# Patient Record
Sex: Male | Born: 1970 | Race: White | Hispanic: No | Marital: Single | Smoking: Former smoker
Health system: Southern US, Community
[De-identification: ages and names within clinical notes are randomized; demographics above are authoritative.]

## PROBLEM LIST (undated history)

## (undated) DIAGNOSIS — E119 Type 2 diabetes mellitus without complications: Secondary | ICD-10-CM

## (undated) DIAGNOSIS — I10 Essential (primary) hypertension: Secondary | ICD-10-CM

## (undated) DIAGNOSIS — J449 Chronic obstructive pulmonary disease, unspecified: Secondary | ICD-10-CM

## (undated) DIAGNOSIS — I509 Heart failure, unspecified: Secondary | ICD-10-CM

## (undated) DIAGNOSIS — I429 Cardiomyopathy, unspecified: Secondary | ICD-10-CM

## (undated) DIAGNOSIS — G4730 Sleep apnea, unspecified: Secondary | ICD-10-CM

## (undated) DIAGNOSIS — M519 Unspecified thoracic, thoracolumbar and lumbosacral intervertebral disc disorder: Secondary | ICD-10-CM

## (undated) DIAGNOSIS — E785 Hyperlipidemia, unspecified: Secondary | ICD-10-CM

## (undated) DIAGNOSIS — T7840XA Allergy, unspecified, initial encounter: Secondary | ICD-10-CM

## (undated) HISTORY — DX: Hyperlipidemia, unspecified: E78.5

## (undated) HISTORY — DX: Chronic obstructive pulmonary disease, unspecified: J44.9

## (undated) HISTORY — DX: Heart failure, unspecified: I50.9

## (undated) HISTORY — DX: Type 2 diabetes mellitus without complications: E11.9

## (undated) HISTORY — DX: Allergy, unspecified, initial encounter: T78.40XA

## (undated) HISTORY — PX: LUMBAR LAMINECTOMY: SHX95

## (undated) HISTORY — DX: Sleep apnea, unspecified: G47.30

---

## 2009-03-03 ENCOUNTER — Emergency Department (HOSPITAL_BASED_OUTPATIENT_CLINIC_OR_DEPARTMENT_OTHER): Admission: EM | Admit: 2009-03-03 | Discharge: 2009-03-03 | Payer: Self-pay | Admitting: Emergency Medicine

## 2009-03-03 ENCOUNTER — Ambulatory Visit: Payer: Self-pay | Admitting: Interventional Radiology

## 2009-07-22 ENCOUNTER — Encounter: Admission: RE | Admit: 2009-07-22 | Discharge: 2009-07-22 | Payer: Self-pay | Admitting: Family Medicine

## 2010-10-12 ENCOUNTER — Encounter: Payer: Self-pay | Admitting: Family Medicine

## 2010-12-29 LAB — DIFFERENTIAL
Lymphs Abs: 1.3 10*3/uL (ref 0.7–4.0)
Monocytes Absolute: 0.9 10*3/uL (ref 0.1–1.0)
Monocytes Relative: 8 % (ref 3–12)
Neutro Abs: 7.7 10*3/uL (ref 1.7–7.7)
Neutrophils Relative %: 74 % (ref 43–77)

## 2010-12-29 LAB — CBC
Hemoglobin: 16.9 g/dL (ref 13.0–17.0)
MCV: 86.2 fL (ref 78.0–100.0)
RBC: 6.01 MIL/uL — ABNORMAL HIGH (ref 4.22–5.81)
WBC: 10.4 10*3/uL (ref 4.0–10.5)

## 2010-12-29 LAB — BASIC METABOLIC PANEL
CO2: 26 mEq/L (ref 19–32)
Calcium: 9.4 mg/dL (ref 8.4–10.5)
Chloride: 103 mEq/L (ref 96–112)
Creatinine, Ser: 0.8 mg/dL (ref 0.4–1.5)
GFR calc Af Amer: >60 mL/min (ref >60)
Sodium: 141 mEq/L (ref 135–145)

## 2010-12-29 LAB — URINE MICROSCOPIC-ADD ON

## 2010-12-29 LAB — URINALYSIS, ROUTINE W REFLEX MICROSCOPIC
Glucose, UA: NEGATIVE mg/dL
Specific Gravity, Urine: 1.023 (ref 1.005–1.030)

## 2013-03-26 ENCOUNTER — Emergency Department (HOSPITAL_COMMUNITY): Payer: Commercial Managed Care - PPO

## 2013-03-26 ENCOUNTER — Encounter (HOSPITAL_COMMUNITY): Payer: Self-pay | Admitting: Emergency Medicine

## 2013-03-26 ENCOUNTER — Emergency Department (HOSPITAL_COMMUNITY)
Admission: EM | Admit: 2013-03-26 | Discharge: 2013-03-26 | Disposition: A | Discharge status: Home / Self Care | Payer: Commercial Managed Care - PPO | Attending: Emergency Medicine | Admitting: Emergency Medicine

## 2013-03-26 DIAGNOSIS — J449 Chronic obstructive pulmonary disease, unspecified: Secondary | ICD-10-CM

## 2013-03-26 DIAGNOSIS — I10 Essential (primary) hypertension: Secondary | ICD-10-CM | POA: Insufficient documentation

## 2013-03-26 DIAGNOSIS — F17200 Nicotine dependence, unspecified, uncomplicated: Secondary | ICD-10-CM | POA: Insufficient documentation

## 2013-03-26 DIAGNOSIS — Z8739 Personal history of other diseases of the musculoskeletal system and connective tissue: Secondary | ICD-10-CM | POA: Insufficient documentation

## 2013-03-26 DIAGNOSIS — Z79899 Other long term (current) drug therapy: Secondary | ICD-10-CM | POA: Insufficient documentation

## 2013-03-26 DIAGNOSIS — R000 Tachycardia, unspecified: Secondary | ICD-10-CM | POA: Insufficient documentation

## 2013-03-26 DIAGNOSIS — R079 Chest pain, unspecified: Secondary | ICD-10-CM | POA: Insufficient documentation

## 2013-03-26 DIAGNOSIS — J441 Chronic obstructive pulmonary disease with (acute) exacerbation: Secondary | ICD-10-CM | POA: Insufficient documentation

## 2013-03-26 HISTORY — DX: Essential (primary) hypertension: I10

## 2013-03-26 LAB — CBC WITH DIFFERENTIAL/PLATELET
Basophils Absolute: 0.1 10*3/uL (ref 0.0–0.1)
Basophils Relative: 1 % (ref 0–1)
HCT: 51.2 % (ref 39.0–52.0)
MCHC: 32.4 g/dL (ref 30.0–36.0)
Monocytes Absolute: 1 10*3/uL (ref 0.1–1.0)
Neutro Abs: 9 10*3/uL — ABNORMAL HIGH (ref 1.7–7.7)
Neutrophils Relative %: 69 % (ref 43–77)
Platelets: 346 10*3/uL (ref 150–400)
RDW: 14.1 % (ref 11.5–15.5)

## 2013-03-26 LAB — BASIC METABOLIC PANEL
Chloride: 102 mEq/L (ref 96–112)
Creatinine, Ser: 0.66 mg/dL (ref 0.50–1.35)
GFR calc Af Amer: >90 mL/min (ref >90)
Sodium: 136 mEq/L (ref 135–145)

## 2013-03-26 LAB — D-DIMER, QUANTITATIVE: D-Dimer, Quant: 0.49 ug/mL-FEU — ABNORMAL HIGH (ref 0.00–0.48)

## 2013-03-26 MED ORDER — PREDNISONE 20 MG PO TABS
40.0000 mg | ORAL_TABLET | Freq: Once | ORAL | Status: AC
  Administered 2013-03-26: 40 mg via ORAL
  Filled 2013-03-26: qty 2

## 2013-03-26 MED ORDER — IPRATROPIUM BROMIDE 0.02 % IN SOLN
0.5000 mg | Freq: Once | RESPIRATORY_TRACT | Status: AC
  Administered 2013-03-26: 0.5 mg via RESPIRATORY_TRACT
  Filled 2013-03-26: qty 2.5

## 2013-03-26 MED ORDER — IOHEXOL 350 MG/ML SOLN
80.0000 mL | Freq: Once | INTRAVENOUS | Status: AC | PRN
  Administered 2013-03-26: 80 mL via INTRAVENOUS

## 2013-03-26 MED ORDER — ALBUTEROL SULFATE HFA 108 (90 BASE) MCG/ACT IN AERS
2.0000 | INHALATION_SPRAY | RESPIRATORY_TRACT | Status: DC | PRN
  Administered 2013-03-26: 2 via RESPIRATORY_TRACT
  Filled 2013-03-26: qty 6.7

## 2013-03-26 MED ORDER — PREDNISONE 20 MG PO TABS: 40.0000 mg | ORAL_TABLET | Freq: Every day | ORAL | Status: DC

## 2013-03-26 MED ORDER — ALBUTEROL SULFATE (5 MG/ML) 0.5% IN NEBU
5.0000 mg | INHALATION_SOLUTION | Freq: Once | RESPIRATORY_TRACT | Status: AC
  Administered 2013-03-26: 5 mg via RESPIRATORY_TRACT
  Filled 2013-03-26: qty 1

## 2013-03-26 NOTE — ED Provider Notes (Addendum)
History    CSN: [id number] Arrival date & time 03/26/13  1300  First MD Initiated Contact with Patient 03/26/13 1314     Chief Complaint  Patient presents with  . Shortness of Breath  . Chest Pain   (Consider location/radiation/quality/duration/timing/severity/associated sxs/prior Treatment) Patient is a 42 y.o. male presenting with shortness of breath. The history is provided by the patient.  Shortness of Breath Severity:  Severe Onset quality:  Sudden Duration:  1 hour Timing:  Constant Progression:  Resolved Chronicity:  New Context comment:  Pt states last week had an episode of SOB lasting about that resolved and was fine till today when he had another episode that lasted about 1 hr and now resolved.  Past Medical History  Diagnosis Date  . Hypertension   . Ruptured disk    Past Surgical History  Procedure Laterality Date  . Back surgery  ruptured disk   No family history on file. History  Substance Use Topics  . Smoking status: Current Some Day Smoker  . Smokeless tobacco: Not on file     Comment: uses ecig  . Alcohol Use: Yes    Review of Systems  Respiratory: Positive for shortness of breath.     Allergies  Review of patient's allergies indicates no known allergies.  Home Medications  No current outpatient prescriptions on file. BP 160/123  Pulse 133  Temp(Src) 97.8 F (36.6 C) (Oral)  Resp 25  Ht 5\' 10"  (1.778 m)  Wt 325 lb (147.419 kg)  BMI 46.63 kg/m2  SpO2 95% Physical Exam  Nursing note and vitals reviewed. Constitutional: He is oriented to person, place, and time. He appears well-developed and well-nourished. No distress.  HENT:  Head: Normocephalic and atraumatic.  Mouth/Throat: Oropharynx is clear and moist.  Eyes: Conjunctivae and EOM are normal. Pupils are equal, round, and reactive to light.  Neck: Normal range of motion. Neck supple.  Cardiovascular: Regular rhythm and intact distal pulses.  Tachycardia present.   No  murmur heard. Pulmonary/Chest: Tachypnea noted. No respiratory distress. He has wheezes. He has no rales.  Abdominal: Soft. He exhibits no distension. There is no tenderness. There is no rebound and no guarding.  Musculoskeletal: Normal range of motion. He exhibits no edema and no tenderness.  Neurological: He is alert and oriented to person, place, and time.  Skin: Skin is warm and dry. No rash noted. No erythema.  Psychiatric: He has a normal mood and affect. His behavior is normal.    ED Course  Procedures (including critical care time) Labs Reviewed  CBC WITH DIFFERENTIAL - Abnormal; Notable for the following:    WBC 13.0 (*)    RBC 5.92 (*)    Neutro Abs 9.0 (*)    All other components within normal limits  D-DIMER, QUANTITATIVE - Abnormal; Notable for the following:    D-Dimer, Quant 0.49 (*)    All other components within normal limits  BASIC METABOLIC PANEL  POCT I-STAT TROPONIN I   Dg Chest 2 View  03/26/2013   *RADIOLOGY REPORT*  Clinical Data: Shortness of breath, left chest pain  CHEST - 2 VIEW  Comparison: None.  Findings: Increased interstitial markings, possibly reflecting mild interstitial edema or bronchitic changes.  No pleural effusion or pneumothorax.  Mild cardiomegaly.  Degenerative changes of the visualized thoracolumbar spine.  IMPRESSION: Increased interstitial markings, possibly reflecting mild interstitial edema or bronchitic changes.   Original Report Authenticated By: Charline Bills, M.D.     Date: 03/26/2013  Rate:  123  Rhythm: sinus tachycardia  QRS Axis: normal  Intervals: normal  ST/T Wave abnormalities: nonspecific ST/T changes  Conduction Disutrbances:none  Narrative Interpretation:   Old EKG Reviewed: none available   1. COPD (chronic obstructive pulmonary disease)     MDM   Patient with episodic shortness of breath that recurred today and is now resolved however patient has persistent tachycardia with pulse between 10/11/1930 and is  also hypertensive. He is a smoker and states he probably has high blood pressure but does not see a doctor. He denies any history of diabetes, coronary artery disease or hyperlipidemia. He recently had a trip to [place] 3 weeks ago and since being back this is the second episode of shortness of breath. Rhythm he is slightly diaphoretic, tachycardic and tachypnea decreased breath sounds and wheezing. He has had intermittent pleuritic chest pain which is currently resolved.  Concern for her PE versus infectious etiology versus COPD exacerbation. A concern for cardiac etiology however troponin sent and EKG shows sinus tachycardia with nonspecific ST and T wave changes.  CBC, BMP, d-dimer, chest x-ray pending. Patient given albuterol and Atrovent to monitor improvement in breath sounds.   2:20 PM CBC with leukocytosis of 13,000.  D-dimer positive at .49.  Troponin neg.  CXR with increased interstitial markings. Will get CTA to further eval.  Pt checked out to Dr. Rubin Payor at 1600.  Gwyneth Sprout, MD 03/27/13 1610  Gwyneth Sprout, MD 04/03/13 1511

## 2013-03-26 NOTE — ED Notes (Signed)
Patient transported to X-ray 

## 2013-03-26 NOTE — ED Notes (Signed)
Pt c/o left side abdominal pain onset 0900. Pt c/o shortness of breath. Pt has similar symptoms 1 week ago but resolved on its own. Pt seen at [facility] and sent here for further eval.

## 2013-03-26 NOTE — ED Notes (Signed)
Pt O2 dropped to 85% while walking in the hall. Pt stated that he was ok and felt fine the whole walk.

## 2013-03-26 NOTE — ED Provider Notes (Signed)
Patient with shortness of breath. CT angiography does not show pulmonary embolism. Patient felt good with ambulation, however his saturation drops to the upper 80s. Patient would rather not be admitted. He is at his baseline dyspnea. He will follow with pulmonology was given inhaler and oral steroids. If he has a return of his shortness of breath he will return  Juliet Rude. Rubin Payor, MD 03/26/13 1820

## 2013-07-22 ENCOUNTER — Encounter (HOSPITAL_COMMUNITY): Payer: Self-pay | Admitting: Emergency Medicine

## 2013-07-22 ENCOUNTER — Emergency Department (HOSPITAL_COMMUNITY): Payer: Commercial Managed Care - PPO

## 2013-07-22 DIAGNOSIS — Z79899 Other long term (current) drug therapy: Secondary | ICD-10-CM

## 2013-07-22 DIAGNOSIS — M519 Unspecified thoracic, thoracolumbar and lumbosacral intervertebral disc disorder: Secondary | ICD-10-CM | POA: Diagnosis present

## 2013-07-22 DIAGNOSIS — I110 Hypertensive heart disease with heart failure: Principal | ICD-10-CM | POA: Diagnosis present

## 2013-07-22 DIAGNOSIS — I5023 Acute on chronic systolic (congestive) heart failure: Secondary | ICD-10-CM | POA: Diagnosis present

## 2013-07-22 DIAGNOSIS — Z7982 Long term (current) use of aspirin: Secondary | ICD-10-CM

## 2013-07-22 DIAGNOSIS — Z6841 Body Mass Index (BMI) 40.0 and over, adult: Secondary | ICD-10-CM

## 2013-07-22 DIAGNOSIS — Z8042 Family history of malignant neoplasm of prostate: Secondary | ICD-10-CM

## 2013-07-22 DIAGNOSIS — R0902 Hypoxemia: Secondary | ICD-10-CM | POA: Diagnosis present

## 2013-07-22 DIAGNOSIS — Z8249 Family history of ischemic heart disease and other diseases of the circulatory system: Secondary | ICD-10-CM

## 2013-07-22 DIAGNOSIS — Z87891 Personal history of nicotine dependence: Secondary | ICD-10-CM

## 2013-07-22 DIAGNOSIS — I509 Heart failure, unspecified: Secondary | ICD-10-CM | POA: Diagnosis present

## 2013-07-22 DIAGNOSIS — Z833 Family history of diabetes mellitus: Secondary | ICD-10-CM

## 2013-07-22 DIAGNOSIS — I428 Other cardiomyopathies: Secondary | ICD-10-CM | POA: Diagnosis present

## 2013-07-22 DIAGNOSIS — I2789 Other specified pulmonary heart diseases: Secondary | ICD-10-CM | POA: Diagnosis present

## 2013-07-22 DIAGNOSIS — G4733 Obstructive sleep apnea (adult) (pediatric): Secondary | ICD-10-CM | POA: Diagnosis present

## 2013-07-22 DIAGNOSIS — I2510 Atherosclerotic heart disease of native coronary artery without angina pectoris: Secondary | ICD-10-CM | POA: Diagnosis present

## 2013-07-22 NOTE — ED Notes (Signed)
The pt is c/o rt lat chest pain for approx one week.  Tonight just pta he felt something pop in that area and had sharp pain in his chest and increased sob.   Hyperventilating on arrival

## 2013-07-23 ENCOUNTER — Inpatient Hospital Stay (HOSPITAL_COMMUNITY)
Admission: EM | Admit: 2013-07-23 | Discharge: 2013-07-26 | DRG: 287 | Disposition: A | Discharge status: Home / Self Care | Payer: Commercial Managed Care - PPO | Attending: Internal Medicine | Admitting: Internal Medicine

## 2013-07-23 ENCOUNTER — Emergency Department (HOSPITAL_COMMUNITY): Payer: Commercial Managed Care - PPO

## 2013-07-23 ENCOUNTER — Encounter (HOSPITAL_COMMUNITY): Payer: Self-pay | Admitting: Internal Medicine

## 2013-07-23 DIAGNOSIS — I5023 Acute on chronic systolic (congestive) heart failure: Secondary | ICD-10-CM | POA: Diagnosis present

## 2013-07-23 DIAGNOSIS — R0602 Shortness of breath: Secondary | ICD-10-CM

## 2013-07-23 DIAGNOSIS — I119 Hypertensive heart disease without heart failure: Secondary | ICD-10-CM | POA: Diagnosis present

## 2013-07-23 DIAGNOSIS — R10A0 Flank pain, unspecified side: Secondary | ICD-10-CM | POA: Diagnosis present

## 2013-07-23 DIAGNOSIS — I10 Essential (primary) hypertension: Secondary | ICD-10-CM

## 2013-07-23 DIAGNOSIS — I5021 Acute systolic (congestive) heart failure: Secondary | ICD-10-CM

## 2013-07-23 DIAGNOSIS — R0609 Other forms of dyspnea: Secondary | ICD-10-CM

## 2013-07-23 DIAGNOSIS — R0603 Acute respiratory distress: Secondary | ICD-10-CM | POA: Diagnosis present

## 2013-07-23 DIAGNOSIS — I5031 Acute diastolic (congestive) heart failure: Secondary | ICD-10-CM

## 2013-07-23 DIAGNOSIS — I420 Dilated cardiomyopathy: Secondary | ICD-10-CM | POA: Diagnosis present

## 2013-07-23 DIAGNOSIS — I369 Nonrheumatic tricuspid valve disorder, unspecified: Secondary | ICD-10-CM

## 2013-07-23 DIAGNOSIS — R109 Unspecified abdominal pain: Secondary | ICD-10-CM | POA: Diagnosis present

## 2013-07-23 DIAGNOSIS — I509 Heart failure, unspecified: Secondary | ICD-10-CM

## 2013-07-23 DIAGNOSIS — M519 Unspecified thoracic, thoracolumbar and lumbosacral intervertebral disc disorder: Secondary | ICD-10-CM | POA: Insufficient documentation

## 2013-07-23 HISTORY — DX: Unspecified thoracic, thoracolumbar and lumbosacral intervertebral disc disorder: M51.9

## 2013-07-23 HISTORY — DX: Morbid (severe) obesity due to excess calories: E66.01

## 2013-07-23 HISTORY — DX: Cardiomyopathy, unspecified: I42.9

## 2013-07-23 LAB — COMPREHENSIVE METABOLIC PANEL
ALT: 27 U/L (ref 0–53)
AST: 23 U/L (ref 0–37)
Albumin: 3.9 g/dL (ref 3.5–5.2)
CO2: 27 mEq/L (ref 19–32)
Chloride: 98 mEq/L (ref 96–112)
GFR calc non Af Amer: >90 mL/min (ref >90)
Sodium: 136 mEq/L (ref 135–145)
Total Bilirubin: 0.7 mg/dL (ref 0.3–1.2)
Total Protein: 7.2 g/dL (ref 6.0–8.3)

## 2013-07-23 LAB — POCT I-STAT 3, ART BLOOD GAS (G3+)
Acid-base deficit: 1 mmol/L (ref 0.0–2.0)
Bicarbonate: 23.9 mEq/L (ref 20.0–24.0)
O2 Saturation: 95 %
TCO2: 25 mmol/L (ref 0–100)
pCO2 arterial: 41.8 mmHg (ref 35.0–45.0)
pO2, Arterial: 81 mmHg (ref 80.0–100.0)

## 2013-07-23 LAB — BASIC METABOLIC PANEL
Calcium: 9.6 mg/dL (ref 8.4–10.5)
Creatinine, Ser: 0.83 mg/dL (ref 0.50–1.35)
GFR calc non Af Amer: >90 mL/min (ref >90)
Glucose, Bld: 140 mg/dL — ABNORMAL HIGH (ref 70–99)
Potassium: 4.1 mEq/L (ref 3.5–5.1)
Sodium: 137 mEq/L (ref 135–145)

## 2013-07-23 LAB — CBC WITH DIFFERENTIAL/PLATELET
Basophils Absolute: 0.1 10*3/uL (ref 0.0–0.1)
Eosinophils Absolute: 0.3 10*3/uL (ref 0.0–0.7)
Eosinophils Relative: 2 % (ref 0–5)
Lymphocytes Relative: 16 % (ref 12–46)
Lymphs Abs: 1.8 10*3/uL (ref 0.7–4.0)
MCH: 28.7 pg (ref 26.0–34.0)
MCV: 86 fL (ref 78.0–100.0)
Neutro Abs: 8.3 10*3/uL — ABNORMAL HIGH (ref 1.7–7.7)
Neutrophils Relative %: 74 % (ref 43–77)
Platelets: 366 10*3/uL (ref 150–400)
RBC: 5.5 MIL/uL (ref 4.22–5.81)
RDW: 14.7 % (ref 11.5–15.5)
WBC: 11.2 10*3/uL — ABNORMAL HIGH (ref 4.0–10.5)

## 2013-07-23 LAB — RAPID URINE DRUG SCREEN, HOSP PERFORMED
Benzodiazepines: NOT DETECTED
Cocaine: NOT DETECTED
Opiates: POSITIVE — AB
Tetrahydrocannabinol: NOT DETECTED

## 2013-07-23 LAB — POCT I-STAT TROPONIN I: Troponin i, poc: 0.04 ng/mL (ref 0.00–0.08)

## 2013-07-23 LAB — HEMOGLOBIN A1C: Hgb A1c MFr Bld: 6 % — ABNORMAL HIGH (ref <5.7)

## 2013-07-23 LAB — MRSA PCR SCREENING: MRSA by PCR: NEGATIVE

## 2013-07-23 LAB — PRO B NATRIURETIC PEPTIDE: Pro B Natriuretic peptide (BNP): 3783 pg/mL — ABNORMAL HIGH (ref 0–125)

## 2013-07-23 MED ORDER — MORPHINE SULFATE 4 MG/ML IJ SOLN
4.0000 mg | Freq: Once | INTRAMUSCULAR | Status: DC
  Filled 2013-07-23: qty 1

## 2013-07-23 MED ORDER — ENOXAPARIN SODIUM 40 MG/0.4ML ~~LOC~~ SOLN
40.0000 mg | SUBCUTANEOUS | Status: DC
  Administered 2013-07-23 – 2013-07-26 (×4): 40 mg via SUBCUTANEOUS
  Filled 2013-07-23 (×5): qty 0.4

## 2013-07-23 MED ORDER — POTASSIUM CHLORIDE CRYS ER 10 MEQ PO TBCR
10.0000 meq | EXTENDED_RELEASE_TABLET | Freq: Two times a day (BID) | ORAL | Status: AC
  Administered 2013-07-23 – 2013-07-25 (×5): 10 meq via ORAL
  Filled 2013-07-23 (×5): qty 1

## 2013-07-23 MED ORDER — ASPIRIN EC 325 MG PO TBEC
325.0000 mg | DELAYED_RELEASE_TABLET | Freq: Every day | ORAL | Status: DC
  Administered 2013-07-23 – 2013-07-25 (×3): 325 mg via ORAL
  Filled 2013-07-23 (×3): qty 1

## 2013-07-23 MED ORDER — IOHEXOL 350 MG/ML SOLN
100.0000 mL | Freq: Once | INTRAVENOUS | Status: AC | PRN
  Administered 2013-07-23: 100 mL via INTRAVENOUS

## 2013-07-23 MED ORDER — FUROSEMIDE 10 MG/ML IJ SOLN: 40.0000 mg | Freq: Two times a day (BID) | INTRAMUSCULAR | Status: DC

## 2013-07-23 MED ORDER — MORPHINE SULFATE 2 MG/ML IJ SOLN
1.0000 mg | INTRAMUSCULAR | Status: DC | PRN
  Administered 2013-07-23: 1 mg via INTRAVENOUS
  Filled 2013-07-23: qty 1

## 2013-07-23 MED ORDER — SODIUM CHLORIDE 0.9 % IJ SOLN: 3.0000 mL | INTRAMUSCULAR | Status: DC | PRN

## 2013-07-23 MED ORDER — MORPHINE SULFATE 4 MG/ML IJ SOLN
4.0000 mg | INTRAMUSCULAR | Status: DC | PRN
  Administered 2013-07-23 – 2013-07-24 (×3): 4 mg via INTRAVENOUS
  Filled 2013-07-23 (×2): qty 1

## 2013-07-23 MED ORDER — SODIUM CHLORIDE 0.9 % IJ SOLN
3.0000 mL | Freq: Two times a day (BID) | INTRAMUSCULAR | Status: DC
  Administered 2013-07-23 – 2013-07-26 (×7): 3 mL via INTRAVENOUS

## 2013-07-23 MED ORDER — HYDROCODONE-ACETAMINOPHEN 5-325 MG PO TABS
1.0000 | ORAL_TABLET | Freq: Four times a day (QID) | ORAL | Status: DC | PRN
  Administered 2013-07-23 (×2): 1 via ORAL
  Administered 2013-07-24 (×2): 2 via ORAL
  Filled 2013-07-23: qty 2
  Filled 2013-07-23 (×2): qty 1
  Filled 2013-07-23: qty 2

## 2013-07-23 MED ORDER — CARVEDILOL 3.125 MG PO TABS
3.1250 mg | ORAL_TABLET | Freq: Two times a day (BID) | ORAL | Status: DC
  Administered 2013-07-23 – 2013-07-26 (×7): 3.125 mg via ORAL
  Filled 2013-07-23 (×9): qty 1

## 2013-07-23 MED ORDER — MORPHINE SULFATE 4 MG/ML IJ SOLN
4.0000 mg | Freq: Once | INTRAMUSCULAR | Status: AC
  Administered 2013-07-23: 4 mg via INTRAVENOUS
  Filled 2013-07-23: qty 1

## 2013-07-23 MED ORDER — FUROSEMIDE 10 MG/ML IJ SOLN
INTRAMUSCULAR | Status: AC
  Filled 2013-07-23: qty 4

## 2013-07-23 MED ORDER — MORPHINE SULFATE 4 MG/ML IJ SOLN
INTRAMUSCULAR | Status: AC
  Filled 2013-07-23: qty 1

## 2013-07-23 MED ORDER — LISINOPRIL 10 MG PO TABS
10.0000 mg | ORAL_TABLET | Freq: Every day | ORAL | Status: DC
  Administered 2013-07-23 – 2013-07-26 (×4): 10 mg via ORAL
  Filled 2013-07-23 (×4): qty 1

## 2013-07-23 MED ORDER — ONDANSETRON HCL 4 MG/2ML IJ SOLN: 4.0000 mg | Freq: Four times a day (QID) | INTRAMUSCULAR | Status: DC | PRN

## 2013-07-23 MED ORDER — FUROSEMIDE 10 MG/ML IJ SOLN
40.0000 mg | Freq: Two times a day (BID) | INTRAMUSCULAR | Status: DC
  Administered 2013-07-23 (×2): 40 mg via INTRAVENOUS
  Filled 2013-07-23 (×4): qty 4

## 2013-07-23 MED ORDER — SODIUM CHLORIDE 0.9 % IV SOLN: 250.0000 mL | INTRAVENOUS | Status: DC | PRN

## 2013-07-23 MED ORDER — ACETAMINOPHEN 325 MG PO TABS: 650.0000 mg | ORAL_TABLET | ORAL | Status: DC | PRN

## 2013-07-23 NOTE — ED Provider Notes (Addendum)
42 year old male comes in with pelvic history of chest pain and dyspnea which have been getting worse. Workup was started in the ED and he became suddenly more dyspneic and diaphoretic. On exam, lungs are clear and heart has regular rate and rhythm. He was noted to be tachycardic and diaphoretic. No neck vein distention is seen. There was 1+ presacral edema and 2+ pretibial edema and was up very likely to be in CHF. Chest x-ray was consistent with CHF and BNP has come back significantly elevated. He was placed on BiPAP and furosemide and improved dramatically. He had a good diuresis. He does not carry a prior diagnosis of CHF. CT angiogram showed bilateral pleural effusions, mild pulmonary edema, and extensive coronary artery calcifications. He'll need to be admitted for further evaluation.  CRITICAL CARE Performed by: Dione Booze Total critical care time: 40 minutes Critical care time was exclusive of separately billable procedures and treating other patients. Critical care was necessary to treat or prevent imminent or life-threatening deterioration. Critical care was time spent personally by me on the following activities: development of treatment plan with patient and/or surrogate as well as nursing, discussions with consultants, evaluation of patient's response to treatment, examination of patient, obtaining history from patient or surrogate, ordering and performing treatments and interventions, ordering and review of laboratory studies, ordering and review of radiographic studies, pulse oximetry and re-evaluation of patient's condition.  Medical screening examination/treatment/procedure(s) were conducted as a shared visit with non-physician practitioner(s) and myself.  I personally evaluated the patient during the encounter.  EKG Interpretation     Ventricular Rate:  120 PR Interval:  146 QRS Duration: 100 QT Interval:  350 QTC Calculation: 494 R Axis:   34 Text Interpretation:  Sinus  tachycardia Left ventricular hypertrophy with repolarization abnormality Abnormal ECG When compared with ECG of 03/26/2013, No significant change was found           Results for orders placed during the hospital encounter of 07/23/13  BASIC METABOLIC PANEL      Result Value Range   Sodium 137  135 - 145 mEq/L   Potassium 4.1  3.5 - 5.1 mEq/L   Chloride 101  96 - 112 mEq/L   CO2 24  19 - 32 mEq/L   Glucose, Bld 140 (*) 70 - 99 mg/dL   BUN 14  6 - 23 mg/dL   Creatinine, Ser 2.84  0.50 - 1.35 mg/dL   Calcium 9.6  8.4 - 13.2 mg/dL   GFR calc non Af Amer >90  >90 mL/min   GFR calc Af Amer >90  >90 mL/min  CBC WITH DIFFERENTIAL      Result Value Range   WBC 11.2 (*) 4.0 - 10.5 K/uL   RBC 5.50  4.22 - 5.81 MIL/uL   Hemoglobin 15.8  13.0 - 17.0 g/dL   HCT 44.0  10.2 - 72.5 %   MCV 86.0  78.0 - 100.0 fL   MCH 28.7  26.0 - 34.0 pg   MCHC 33.4  30.0 - 36.0 g/dL   RDW 36.6  44.0 - 34.7 %   Platelets 366  150 - 400 K/uL   Neutrophils Relative % 74  43 - 77 %   Neutro Abs 8.3 (*) 1.7 - 7.7 K/uL   Lymphocytes Relative 16  12 - 46 %   Lymphs Abs 1.8  0.7 - 4.0 K/uL   Monocytes Relative 7  3 - 12 %   Monocytes Absolute 0.8  0.1 - 1.0 K/uL  Eosinophils Relative 2  0 - 5 %   Eosinophils Absolute 0.3  0.0 - 0.7 K/uL   Basophils Relative 1  0 - 1 %   Basophils Absolute 0.1  0.0 - 0.1 K/uL  PRO B NATRIURETIC PEPTIDE      Result Value Range   Pro B Natriuretic peptide (BNP) 3783.0 (*) 0 - 125 pg/mL  POCT I-STAT TROPONIN I      Result Value Range   Troponin i, poc 0.04  0.00 - 0.08 ng/mL   Comment 3            Dg Chest 2 View  07/22/2013   CLINICAL DATA:  Shortness of breath for 1 hr since feeling a pop and a hot sensation at the right side of is lower chest, history COPD, former smoker, hypertension  EXAM: CHEST  2 VIEW  COMPARISON:  03/26/2013  FINDINGS: Enlargement of cardiac silhouette with pulmonary vascular congestion.  Question medial right basilar infiltrate, markings slightly  increased versus previous study.  Mild chronic bronchitic changes.  Remaining lungs clear.  No pleural effusion or pneumothorax.  Bones demineralized.  IMPRESSION: Cancer congestion  Bronchitic changes with question medial right basilar infiltrate.   Electronically Signed   By: Ulyses Southward M.D.   On: 07/22/2013 23:51   Ct Angio Chest Pe W/cm &/or Wo Cm  07/23/2013   CLINICAL DATA:  Shortness of breath.  EXAM: CT ANGIOGRAPHY CHEST WITH CONTRAST  TECHNIQUE: Multidetector CT imaging of the chest was performed using the standard protocol during bolus administration of intravenous contrast. Multiplanar CT image reconstructions including MIPs were obtained to evaluate the vascular anatomy.  CONTRAST:  OMNIPAQUE IOHEXOL 350 MG/ML SOLN  COMPARISON:  03/26/2013  FINDINGS: THORACIC INLET/BODY WALL:  No acute abnormality.  MEDIASTINUM:  Cardiomegaly, with the left ventricle particularly dilated. Extensive coronary artery atherosclerosis, age advanced. No pericardial effusion. No pulmonary embolism seen. Sensitivity decreased beyond the segmental pulmonary artery at multiple levels due to respiratory motion. Mild lymphadenopathy, with interval increase likely congestive.  LUNG WINDOWS:  Diffuse interlobular septal thickening with patchy ground-glass opacities small, layering bilateral pleural effusions. There is a diffuse bronchial wall thickening which is likely interstitial/bronchovascular edema. No asymmetric opacity to suggest superimposed consolidative pneumonia.  UPPER ABDOMEN:  No acute findings.  OSSEOUS:  No acute fracture.  No suspicious lytic or blastic lesions.  Review of the MIP images confirms the above findings.  IMPRESSION: 1. CHF. 2. Cardiomegaly and extensive, age advanced coronary artery atherosclerosis. 3. Negative for pulmonary embolism to the level of the segmental arteries.   Electronically Signed   By: Tiburcio Pea M.D.   On: 07/23/2013 03:18   Images viewed by me.   Case is discussed  with Dr. Lovell Sheehan of [facility] who agrees to admit the patient.  Dione Booze, MD 07/23/13 0340  Dione Booze, MD 07/23/13 [phone]

## 2013-07-23 NOTE — Progress Notes (Addendum)
Patient admitted by Dr. Lovell Sheehan this AM.  Please see H&P.  Will order pain medications and await further work up. 1. Acute CHF- New, CHF Protocol ordered, and Diurese with IV lasix, Monitor electrolytes, 2-D ECHO ordered, and Initiate Carvedilol, and Lisinopril Rx. Cycle Troponins, and check TSH level.  2. Respiratory Distress- Due to #1, Placed on BiPAP-wean as toelrated.  3. Chest pain- Cycle troponins. CTA of chest Negative for PE or dissection.  3. HTN- Monitor BPs, Started carvedilol, and Lisinopril rx.  4. Morbid Obesity- encourage Weight Loss.   Stephen Canary DO  Addendum EF came back as 20%- cards consulted

## 2013-07-23 NOTE — H&P (Signed)
[facility] History and Physical  Stephen Kane WUJ:[id number] DOB: [birth year] DOA: 07/23/2013  Referring physician:   EDP PCP: No PCP Per Patient  Specialists:   Chief Complaint: SOB  HPI: Stephen Kane is a 42 y.o. male with a history of HTN who presents to the ED with complaints of worsening SOB over the past week.   He came to the ED tonight due to the SOB and pain in his right side. He denies having chest pain.  He does report having orthopnea and DOE, and he had not noticed that he had lower extremity edema until it was pointed out to him by the EDP.    When he arrived in the ED, he was hypoxic and was placed on BIPAP and on his evaluation his BNP returned elevated at 3783.0, and a Chest X-Ray and CTA of the Chest revealed Cardiomegaly, and diffuse Interstitial Bronchovascular Edema. He was administered IV lasix x 1 dose and referred for medical admission.       Review of Systems: The patient denies anorexia, fever, chills, headaches, weight loss,, vision loss, diplopia, dizziness, decreased hearing, rhinitis, hoarseness, syncope, balance deficits, cough, hemoptysis, abdominal pain, nausea, vomiting, diarrhea, constipation, hematemesis, melena, hematochezia, severe indigestion/heartburn, dysuria, hematuria, incontinence, muscle weakness, suspicious skin lesions, transient blindness, difficulty walking, depression, unusual weight change, abnormal bleeding, enlarged lymph nodes, angioedema, and breast masses.    Past Medical History  Diagnosis Date  . Hypertension   . Ruptured disk     Past Surgical History  Procedure Laterality Date  . Back surgery  ruptured disk    Lumbar Spine    Prior to Admission medications   Medication Sig Start Date End Date Taking? Authorizing Provider  albuterol (PROVENTIL HFA;VENTOLIN HFA) 108 (90 BASE) MCG/ACT inhaler Inhale 2 puffs into the lungs every 6 (six) hours as needed for wheezing (wheezing).   Yes Historical Provider, MD    No  Known Allergies  Social History:  reports that he has been smoking.  He does not have any smokeless tobacco history on file. He reports that he drinks alcohol. He reports that he does not use illicit drugs.     Family History  Problem Relation Age of Onset  . Diabetes Father   . Diabetes Father      Physical Exam:  GEN:  Pleasant Morbidly Obese 42 y.o.  Caucasian male  examined  and in no acute distress; cooperative with exam Filed Vitals:   07/23/13 0245 07/23/13 0345 07/23/13 0430 07/23/13 0450  BP: 142/93 127/80 131/77   Pulse: 105 100 97 94  Temp:      Resp: 23 19 19 19   SpO2: 99% 99% 98% 97%   Blood pressure 131/77, pulse 94, temperature 98.1 F (36.7 C), resp. rate 19, SpO2 97.00%. PSYCH: He is alert and oriented x4; does not appear anxious does not appear depressed; affect is normal HEENT: Normocephalic and Atraumatic, Mucous membranes pink; PERRLA; EOM intact; Fundi:  Benign;  No scleral icterus, Nares: Patent, Oropharynx: Clear, Fair Dentition, Neck:  FROM, no cervical lymphadenopathy nor thyromegaly or carotid bruit; no JVD; Breasts:: Not examined CHEST WALL: No tenderness CHEST: Normal respiration, clear to auscultation bilaterally HEART: Regular rate and rhythm; no murmurs rubs or gallops BACK: No kyphosis or scoliosis; no CVA tenderness ABDOMEN: Positive Bowel Sounds,  Obese, soft non-tender; no masses, no organomegaly, no pannus; no intertriginous candida. Rectal Exam: Not done EXTREMITIES: No  cyanosis, clubbing or  2+ BLE edema; no ulcerations. Genitalia: not examined PULSES: 2+ and  symmetric SKIN: Normal hydration no rash or ulceration CNS: Cranial nerves 2-12 grossly intact no focal neurologic deficit    Labs on Admission:  Basic Metabolic Panel:  Recent Labs Lab 07/23/13 0101  NA 137  K 4.1  CL 101  CO2 24  GLUCOSE 140*  BUN 14  CREATININE 0.83  CALCIUM 9.6   Liver Function Tests: No results found for this basename: AST, ALT, ALKPHOS,  BILITOT, PROT, ALBUMIN,  in the last 168 hours No results found for this basename: LIPASE, AMYLASE,  in the last 168 hours No results found for this basename: AMMONIA,  in the last 168 hours CBC:  Recent Labs Lab 07/23/13 0101  WBC 11.2*  NEUTROABS 8.3*  HGB 15.8  HCT 47.3  MCV 86.0  PLT 366   Cardiac Enzymes: No results found for this basename: CKTOTAL, CKMB, CKMBINDEX, TROPONINI,  in the last 168 hours  BNP (last 3 results)  Recent Labs  07/23/13 0215  PROBNP 3783.0*   CBG: No results found for this basename: GLUCAP,  in the last 168 hours  Radiological Exams on Admission: Dg Chest 2 View  07/22/2013   CLINICAL DATA:  Shortness of breath for 1 hr since feeling a pop and a hot sensation at the right side of is lower chest, history COPD, former smoker, hypertension  EXAM: CHEST  2 VIEW  COMPARISON:  03/26/2013  FINDINGS: Enlargement of cardiac silhouette with pulmonary vascular congestion.  Question medial right basilar infiltrate, markings slightly increased versus previous study.  Mild chronic bronchitic changes.  Remaining lungs clear.  No pleural effusion or pneumothorax.  Bones demineralized.  IMPRESSION: Cancer congestion  Bronchitic changes with question medial right basilar infiltrate.   Electronically Signed   By: Ulyses Southward M.D.   On: 07/22/2013 23:51   Ct Angio Chest Pe W/cm &/or Wo Cm  07/23/2013   CLINICAL DATA:  Shortness of breath.  EXAM: CT ANGIOGRAPHY CHEST WITH CONTRAST  TECHNIQUE: Multidetector CT imaging of the chest was performed using the standard protocol during bolus administration of intravenous contrast. Multiplanar CT image reconstructions including MIPs were obtained to evaluate the vascular anatomy.  CONTRAST:  OMNIPAQUE IOHEXOL 350 MG/ML SOLN  COMPARISON:  03/26/2013  FINDINGS: THORACIC INLET/BODY WALL:  No acute abnormality.  MEDIASTINUM:  Cardiomegaly, with the left ventricle particularly dilated. Extensive coronary artery atherosclerosis, age  advanced. No pericardial effusion. No pulmonary embolism seen. Sensitivity decreased beyond the segmental pulmonary artery at multiple levels due to respiratory motion. Mild lymphadenopathy, with interval increase likely congestive.  LUNG WINDOWS:  Diffuse interlobular septal thickening with patchy ground-glass opacities small, layering bilateral pleural effusions. There is a diffuse bronchial wall thickening which is likely interstitial/bronchovascular edema. No asymmetric opacity to suggest superimposed consolidative pneumonia.  UPPER ABDOMEN:  No acute findings.  OSSEOUS:  No acute fracture.  No suspicious lytic or blastic lesions.  Review of the MIP images confirms the above findings.  IMPRESSION: 1. CHF. 2. Cardiomegaly and extensive, age advanced coronary artery atherosclerosis. 3. Negative for pulmonary embolism to the level of the segmental arteries.   Electronically Signed   By: Tiburcio Pea M.D.   On: 07/23/2013 03:18     EKG: Independently reviewed. Sinus Tachycardia, LVH Findings, No acute S-T changes.      Assessment/Plan Principal Problem:   Acute diastolic CHF (congestive heart failure) Active Problems:   Respiratory distress   Hypertension    1.  Acute Diastolic CHF-  New,  CHF Protocol ordered, and Diurese with IV lasix,  Monitor electrolytes, 2-D ECHO ordered, and Initiate Carvedilol, and  Lisinopril  Rx.   Cycle Troponins, and check TSH level.    2.  Respiratory Distress- Due to #1,  Placed on BiPAP.     3.  Chest pain-  Cycle troponins.   CTA of  chest Negative for PE or dissection.     3.  HTN-   Monitor BPs,  Started carvedilol, and Lisinopril rx.     4.  Morbid Obesity-  Needed Weight Loss.    5.  DVT prophylaxis with Lovenox.        Code Status:   FULL CODE Family Communication:    No Family present Disposition Plan:       Inpatient  Time spent:   76 Minutes  Ron Parker [facility] Pager [phone]  If 7PM-7AM, please contact  night-coverage [email or web address] Password [email or web address] 07/23/2013, 5:16 AM

## 2013-07-23 NOTE — Plan of Care (Signed)
Problem: Phase I Progression Outcomes Goal: Pain controlled with appropriate interventions Outcome: Not Progressing Readjusting pain medications, encouraging other pain relief measures (repositioning, distraction)

## 2013-07-23 NOTE — Progress Notes (Signed)
Utilization Review Completed.Stephen Kane T11/10/2012  

## 2013-07-23 NOTE — ED Provider Notes (Signed)
CSN: [id number]     Arrival date & time 07/22/13  2250 History   First MD Initiated Contact with Patient 07/23/13 0024     Chief Complaint  Patient presents with  . Shortness of Breath   (Consider location/radiation/quality/duration/timing/severity/associated sxs/prior Treatment) HPI History provided by pt.   Pt has had mild pain in right anterior chest for the past week.  While sitting at his desk last night, he coughed, heard a pop in his right side, and has had severe, non-radiating, pleuritic pain that is aggravated by movement and associated w/ SOB ever since.  Denies fever, cough, abd pain, N/V, LE edema/pain.  No recent trauma.  No RF for PE.   Past Medical History  Diagnosis Date  . Hypertension   . Ruptured disk    Past Surgical History  Procedure Laterality Date  . Back surgery  ruptured disk   No family history on file. History  Substance Use Topics  . Smoking status: Current Some Day Smoker  . Smokeless tobacco: Not on file     Comment: uses ecig  . Alcohol Use: Yes    Review of Systems  All other systems reviewed and are negative.    Allergies  Review of patient's allergies indicates no known allergies.  Home Medications   Current Outpatient Rx  Name  Route  Sig  Dispense  Refill  . albuterol (PROVENTIL HFA;VENTOLIN HFA) 108 (90 BASE) MCG/ACT inhaler   Inhalation   Inhale 2 puffs into the lungs every 6 (six) hours as needed for wheezing (wheezing).          BP 150/99  Pulse 97  Temp(Src) 98.1 F (36.7 C)  Resp 32  SpO2 96% Physical Exam  Nursing note and vitals reviewed. Constitutional: He is oriented to person, place, and time. He appears well-developed and well-nourished. No distress.  HENT:  Head: Normocephalic and atraumatic.  Eyes:  Normal appearance  Neck: Normal range of motion.  Cardiovascular: Regular rhythm and intact distal pulses.   tachycardic  Pulmonary/Chest: Breath sounds normal. No respiratory distress. He exhibits no  tenderness.  Pleuritic pain reported.  Shallow respirations.  Tachypneic   Abdominal: Soft. Bowel sounds are normal. He exhibits no distension. There is no tenderness. There is no guarding.  Musculoskeletal: Normal range of motion.  No peripheral edema or calf tenderness  Neurological: He is alert and oriented to person, place, and time.  Skin: Skin is warm. No rash noted.  Clammy  Psychiatric: He has a normal mood and affect. His behavior is normal.    ED Course  Procedures (including critical care time) Labs Review Labs Reviewed  BASIC METABOLIC PANEL - Abnormal; Notable for the following:    Glucose, Bld 140 (*)    All other components within normal limits  CBC WITH DIFFERENTIAL - Abnormal; Notable for the following:    WBC 11.2 (*)    Neutro Abs 8.3 (*)    All other components within normal limits   Imaging Review Dg Chest 2 View  07/22/2013   CLINICAL DATA:  Shortness of breath for 1 hr since feeling a pop and a hot sensation at the right side of is lower chest, history COPD, former smoker, hypertension  EXAM: CHEST  2 VIEW  COMPARISON:  03/26/2013  FINDINGS: Enlargement of cardiac silhouette with pulmonary vascular congestion.  Question medial right basilar infiltrate, markings slightly increased versus previous study.  Mild chronic bronchitic changes.  Remaining lungs clear.  No pleural effusion or pneumothorax.  Bones demineralized.  IMPRESSION: Cancer congestion  Bronchitic changes with question medial right basilar infiltrate.   Electronically Signed   By: Ulyses Southward M.D.   On: 07/22/2013 23:51    EKG Interpretation   None       MDM  No diagnosis found. 42yo M w/ h/o HTN, COPD and on CPAP at night, presents w/ pleuritic R chest pain and SOB x 1 week, worse since coughing last night.  No recent fever or cough.  No recent trauma.  No RF for PE.  On exam, afebrile, movement limited by pain, no respiratory distress but tachypneic and tachycardic, nml breath sounds, no signs  DVT.  CXR most consistent w/ heart failure.  Labs and EKG unremarkable w/ exception of elevated BNP.  CTA chest was ordered to r/o PE.  Prior to this exam, patient became increasingly dyspneic, hypoxic to upper 80s on room air, and pain more severe.  Dr. Preston Fleeting examined and ordered Bipap and lasix.  Currently, VSS but patient more comfortable.  2:16 AM    CT shows CHF.  Patients status continues to improve.  Dr. Preston Fleeting to admit to internal medicine for further evaluation and treatment.  4:07 AM    Otilio Miu, PA-C 07/23/13 0408

## 2013-07-23 NOTE — Progress Notes (Signed)
  Echocardiogram 2D Echocardiogram has been performed.  Stephen Kane [name] 07/23/2013, 9:09 AM

## 2013-07-23 NOTE — Consult Note (Signed)
Cardiology Consult Note  Admit date: 07/23/2013 Name: Stephen Kane 42 y.o.  male DOB:  [birth year] MRN:  [id number]  Today's date:  07/23/2013  Referring Physician:    [facility]  Primary Physician:    None current  Reason for Consultation:    New onset congestive heart failure  IMPRESSIONS: 1. Chronic systolic congestive heart failure 2. Cardiomyopathy of undetermined type 3. Morbid obesity 4. Coronary calcification seen on CT angiogram of chest 5. History of hypertension 6. Lumbar disc disease 7. Flank pain that is more suggestive of a disc problem or musculoskeletal etiology  His echocardiogram shows a dilated left ventricle and right ventricle which suggests that this is a chronic rather than acute process. His exercise capacity is not that impaired and the flank pain is really not suggestive of cardiac etiology. He will need intensive heart failure evaluation and management.  The cardiomyopathy could be related to coronary artery disease, previous treatment for weight loss, obesity, or idiopathic. His history is really not suggestive of long-standing uncontrolled hypertension.  RECOMMENDATION: 1. Initiate optimal medical therapy for congestive heart failure at this point including beta blockers, ACE inhibitors, and the initiation of spironolactone 2. Intravenous diuresis to help with symptoms 3. Because of the presence of coronary calcifications he will need cardiac catheterization to exclude coronary artery disease that is severely obstructive as an etiologic cause of his heart failure. 4. Consultation with the advanced heart failure service because of the young age and marked depression of his LV function. Dr. Gala Romney has been consulted and may consider catheterization through the radial approach because of his obesity.  HISTORY: This 42 year old male has previously been healthy except lumbar disc disease and significant low back pain. He has been severely and  morbidly obese. He stated that he had injections that were prescribed for weight loss via a [employer] who is a Surveyor, mining here in town several years ago. He lost an extensive amount of weight but has gained back most of it. He has taken several other weight loss preparations in the past. There is no history of heavy alcohol abuse. He does have prior hypertension but stated that it got better when he lost weight. He has never had angina.  He was in the emergency room in July with some dyspnea and had a CT angiogram that showed coronary calcifications that were more advanced than one would expect for age. He presented with right flank pain where he had difficulty moving or bending his thorax and came to the emergency room mainly for this. He had another CT angiogram that did not show evidence of pulmonary embolus and had a BNP that was mildly elevated. Troponins were negative. An echocardiogram showed an ejection fraction of 20% with a dilated left and right ventricle. He continues to have the flank pain. He has never had chest discomfort suggestive of angina. One week ago he was able to walk 4-5 blocks to the [employer] pro football game without stopping. He was able to get to his seat in the stadium without difficulty. He did ride up 1 escalator and walk up one flight of stairs and noted only mild dyspnea. He may have had some mild orthopnea when he first goes to bed. He does have a history of significant sleep apnea.  Past Medical History  Diagnosis Date  . Hypertension   . Lumbar disc disease   . Morbidly obese   . Cardiomyopathy       Past Surgical History  Procedure Laterality  Date  . Lumbar laminectomy       Allergies:  has No Known Allergies.   Medications: Prior to Admission medications   Medication Sig Start Date End Date Taking? Authorizing Provider  albuterol (PROVENTIL HFA;VENTOLIN HFA) 108 (90 BASE) MCG/ACT inhaler Inhale 2 puffs into the lungs every 6 (six)  hours as needed for wheezing (wheezing).   Yes Historical Provider, MD    Family History: Family Status  Relation Status Death Age  . Father Alive     diabetes  . Mother Alive     CHF, diabetes  . Sister Alive     obesity    Social History:   reports that he quit smoking about 4 weeks ago. His smoking use included Cigarettes. He smoked 1.00 pack per day. He does not have any smokeless tobacco history on file. He reports that he drinks alcohol. He reports that he does not use illicit drugs.   History   Social History Narrative    works in Consulting civil engineer for [employer]. Divorced has 2 children     Review of Systems: He has been obese for many years, wears glasses to read. No GI problems, no GU problems, does have some chronic low back pain. Other than as noted above the remainder of the review of systems is unremarkable.  Physical Exam: BP 127/83  Pulse 101  Temp(Src) 97.2 F (36.2 C) (Oral)  Resp 24  Ht 5\' 10"  (1.778 m)  Wt 140.9 kg (310 lb 10.1 oz)  BMI 44.57 kg/m2  SpO2 95%  General appearance: Pleasant obese white male in no acute distress Head: Normocephalic, without obvious abnormality, atraumatic Eyes: conjunctivae/corneas clear. PERRL, EOM's intact. Fundi not examined.  Neck: no adenopathy, no carotid bruit, no JVD and supple, symmetrical, trachea midline Lungs: clear to auscultation bilaterally Heart: Regular rate and rhythm, S4 present, no definite S3 heard, no murmur Abdomen: soft, non-tender; bowel sounds normal; no masses,  no organomegaly Rectal: deferred Extremities: Trace edema, extremities are well perfused Pulses: 2+ and symmetric Skin: Skin color, texture, turgor normal. No rashes or lesions Neurologic: Alert and oriented X 3, normal strength and tone. Normal symmetric reflexes. Normal coordination and gait  Labs: CBC  Recent Labs  07/23/13 0101  WBC 11.2*  RBC 5.50  HGB 15.8  HCT 47.3  PLT 366  MCV 86.0  MCH 28.7  MCHC 33.4  RDW 14.7   LYMPHSABS 1.8  MONOABS 0.8  EOSABS 0.3  BASOSABS 0.1   CMP   Recent Labs  07/23/13 0101  NA 137  K 4.1  CL 101  CO2 24  GLUCOSE 140*  BUN 14  CREATININE 0.83  CALCIUM 9.6  GFRNONAA >90  GFRAA >90   BNP (last 3 results)  Recent Labs  07/23/13 0215  PROBNP 3783.0*   Cardiac Panel (last 3 results)  Recent Labs  07/23/13 0402  TROPIPOC 0.04    Radiology: Cardiomegaly with interstitial congestion  EKG: Voltage for LVH  Signed:  W. Ashley Royalty MD [facility]   Cardiology Consultant  07/23/2013, 1:14 PM

## 2013-07-23 NOTE — ED Provider Notes (Deleted)
Medical screening examination/treatment/procedure(s) were performed by non-physician practitioner and as supervising physician I was immediately available for consultation/collaboration.   Dione Booze, MD 07/23/13 [phone]

## 2013-07-24 ENCOUNTER — Encounter (HOSPITAL_COMMUNITY)
Admission: EM | Disposition: A | Discharge status: Home / Self Care | Payer: Self-pay | Source: Home / Self Care | Attending: Internal Medicine

## 2013-07-24 DIAGNOSIS — I2510 Atherosclerotic heart disease of native coronary artery without angina pectoris: Secondary | ICD-10-CM

## 2013-07-24 DIAGNOSIS — I5021 Acute systolic (congestive) heart failure: Secondary | ICD-10-CM

## 2013-07-24 DIAGNOSIS — R109 Unspecified abdominal pain: Secondary | ICD-10-CM

## 2013-07-24 HISTORY — PX: LEFT AND RIGHT HEART CATHETERIZATION WITH CORONARY ANGIOGRAM: SHX5449

## 2013-07-24 LAB — BASIC METABOLIC PANEL
CO2: 28 mEq/L (ref 19–32)
Calcium: 9.3 mg/dL (ref 8.4–10.5)
Creatinine, Ser: 0.77 mg/dL (ref 0.50–1.35)
GFR calc Af Amer: >90 mL/min (ref >90)
GFR calc non Af Amer: >90 mL/min (ref >90)
Glucose, Bld: 97 mg/dL (ref 70–99)
Potassium: 4.3 mEq/L (ref 3.5–5.1)

## 2013-07-24 LAB — CBC
Hemoglobin: 15.1 g/dL (ref 13.0–17.0)
MCH: 28 pg (ref 26.0–34.0)
MCH: 28.5 pg (ref 26.0–34.0)
MCHC: 32.3 g/dL (ref 30.0–36.0)
MCV: 87.2 fL (ref 78.0–100.0)
MCV: 88.5 fL (ref 78.0–100.0)
Platelets: 395 10*3/uL (ref 150–400)
RDW: 14.9 % (ref 11.5–15.5)

## 2013-07-24 LAB — URINALYSIS, ROUTINE W REFLEX MICROSCOPIC
Bilirubin Urine: NEGATIVE
Glucose, UA: NEGATIVE mg/dL
Hgb urine dipstick: NEGATIVE
Specific Gravity, Urine: 1.035 — ABNORMAL HIGH (ref 1.005–1.030)
Urobilinogen, UA: 0.2 mg/dL (ref 0.0–1.0)
pH: 5.5 (ref 5.0–8.0)

## 2013-07-24 LAB — CREATININE, SERUM: Creatinine, Ser: 0.84 mg/dL (ref 0.50–1.35)

## 2013-07-24 LAB — POCT I-STAT 3, VENOUS BLOOD GAS (G3P V)
Acid-Base Excess: 3 mmol/L — ABNORMAL HIGH (ref 0.0–2.0)
Bicarbonate: 30.4 mEq/L — ABNORMAL HIGH (ref 20.0–24.0)
O2 Saturation: 61 %
O2 Saturation: 64 %
TCO2: 32 mmol/L (ref 0–100)
TCO2: 32 mmol/L (ref 0–100)
pCO2, Ven: 57.9 mmHg — ABNORMAL HIGH (ref 45.0–50.0)
pH, Ven: 7.33 — ABNORMAL HIGH (ref 7.250–7.300)
pH, Ven: 7.331 — ABNORMAL HIGH (ref 7.250–7.300)
pO2, Ven: 35 mmHg (ref 30.0–45.0)

## 2013-07-24 LAB — LIPID PANEL
HDL: 42 mg/dL (ref >39)
LDL Cholesterol: 132 mg/dL — ABNORMAL HIGH (ref 0–99)
Total CHOL/HDL Ratio: 4.8 RATIO
VLDL: 26 mg/dL (ref 0–40)

## 2013-07-24 LAB — POCT I-STAT 3, ART BLOOD GAS (G3+)
Acid-base deficit: 9 mmol/L — ABNORMAL HIGH (ref 0.0–2.0)
O2 Saturation: 89 %

## 2013-07-24 LAB — MAGNESIUM: Magnesium: 2.1 mg/dL (ref 1.5–2.5)

## 2013-07-24 LAB — TROPONIN I: Troponin I: <0.3 ng/mL (ref <0.30)

## 2013-07-24 SURGERY — LEFT AND RIGHT HEART CATHETERIZATION WITH CORONARY ANGIOGRAM
Anesthesia: LOCAL

## 2013-07-24 MED ORDER — ENOXAPARIN SODIUM 40 MG/0.4ML ~~LOC~~ SOLN: 40.0000 mg | SUBCUTANEOUS | Status: DC

## 2013-07-24 MED ORDER — TRAMADOL HCL 50 MG PO TABS
100.0000 mg | ORAL_TABLET | Freq: Four times a day (QID) | ORAL | Status: DC | PRN
  Administered 2013-07-24 – 2013-07-25 (×2): 100 mg via ORAL
  Filled 2013-07-24 (×2): qty 2

## 2013-07-24 MED ORDER — SODIUM CHLORIDE 0.9 % IV SOLN
INTRAVENOUS | Status: AC
  Administered 2013-07-24: 11:00:00 via INTRAVENOUS

## 2013-07-24 MED ORDER — ACETAMINOPHEN 325 MG PO TABS: 650.0000 mg | ORAL_TABLET | ORAL | Status: DC | PRN

## 2013-07-24 MED ORDER — METHOCARBAMOL 500 MG PO TABS
500.0000 mg | ORAL_TABLET | Freq: Four times a day (QID) | ORAL | Status: DC | PRN
  Administered 2013-07-24: 500 mg via ORAL
  Filled 2013-07-24 (×2): qty 1

## 2013-07-24 MED ORDER — LIDOCAINE HCL (PF) 1 % IJ SOLN
INTRAMUSCULAR | Status: AC
  Filled 2013-07-24: qty 30

## 2013-07-24 MED ORDER — HEPARIN SODIUM (PORCINE) 1000 UNIT/ML IJ SOLN
INTRAMUSCULAR | Status: AC
  Filled 2013-07-24: qty 1

## 2013-07-24 MED ORDER — HEPARIN (PORCINE) IN NACL 2-0.9 UNIT/ML-% IJ SOLN
INTRAMUSCULAR | Status: AC
  Filled 2013-07-24: qty 1000

## 2013-07-24 MED ORDER — FUROSEMIDE 10 MG/ML IJ SOLN
80.0000 mg | Freq: Two times a day (BID) | INTRAMUSCULAR | Status: AC
  Administered 2013-07-24 – 2013-07-25 (×4): 80 mg via INTRAVENOUS
  Filled 2013-07-24 (×3): qty 8

## 2013-07-24 MED ORDER — NITROGLYCERIN 0.2 MG/ML ON CALL CATH LAB
INTRAVENOUS | Status: AC
  Filled 2013-07-24: qty 1

## 2013-07-24 MED ORDER — VERAPAMIL HCL 2.5 MG/ML IV SOLN
INTRAVENOUS | Status: AC
  Filled 2013-07-24: qty 2

## 2013-07-24 MED ORDER — ONDANSETRON HCL 4 MG/2ML IJ SOLN: 4.0000 mg | Freq: Four times a day (QID) | INTRAMUSCULAR | Status: DC | PRN

## 2013-07-24 NOTE — H&P (View-Only) (Signed)
Advanced Heart Failure Team Consult Note  Referring Physician: Dr Tilley  Primary Physician: Primary Cardiologist:  None   Reason for Consultation: New Diagnosis Heart Failure   HPI:    Stephen Kane is a 41  year old male with history of HTN, obesity, former smoker quit 2010- 1ppd,  OSA CPAP and a lumbar surgery for ruptured disk with no known coronary disease referred to the HF team by Dr Tilley due to new diagnosis of acute systolic heart failure with severely reduced EF 20%.  In 2010 he says he lost 100 pounds with hcg but has since gained it all back.   Evaluated [facility] ED 03/2013 with . + Increased dyspnea. CT of chest negative for PE. Oxygen dropped to 80s. He was given breathing treatments ans later discharged that day with recommendations for pulmonary follow up. He did not follow up.   Uses CPAP nightly at home. He does not have PCP.SOB walking up steps.  A week ago he says he had pain in his R side.   Does not exercise. Works full time in IT  He presented to [facility] ED 07/23/13 with R upper back and side pain. + progressive dyspnea on exertion. Hypoxic on arrival and placed on short term BiPap. Admit Pro BNP 3783, CEs negative, K 4.1 , TSH 2.2, 11.2 , Hemoglobin 15.3. He placed intermittent IV lasix 40 mg every 12 hours.  ECHO revealed EF 20%,moderately to severely dilated LV and mildly dilated RV.  CT of chest negative for PE but with advanced atheroscerosis. Cardiology was consulted and he was started on carvedilol ECHO revealed EF 20%,moderately to severely dilated LV and mildly dilated RV.     Denies SOB. + Orthopnea.   SH: Lives with Mom, Dad and daughter. Quit smoking 1 month ago   FH: Mom MI x 2 and Heart Failure DM         Father prostate cancer DM    Review of Systems: [y] = yes, [ ] = no   General: Weight gain [Y ]; Weight loss [ ]; Anorexia [ ]; Fatigue [Y ]; Fever [ ]; Chills [ ]; Weakness [ ]  Cardiac: Chest pain/pressure [ ]; Resting SOB [ ]; Exertional SOB [ Y]; Orthopnea [Y ];  Pedal Edema [ ]; Palpitations [ ]; Syncope [ ]; Presyncope [ ]; Paroxysmal nocturnal dyspnea[ ]  Pulmonary: Cough [ ]; Wheezing[ ]; Hemoptysis[ ]; Sputum [ ]; Snoring [ ]  GI: Vomiting[ ]; Dysphagia[ ]; Melena[ ]; Hematochezia [ ]; Heartburn[ ]; Abdominal pain [ ]; Constipation [ ]; Diarrhea [ ]; BRBPR [ ]  GU: Hematuria[ ]; Dysuria [ ]; Nocturia[ ]  Vascular: Pain in legs with walking [ ]; Pain in feet with lying flat [ ]; Non-healing sores [ ]; Stroke [ ]; TIA [ ]; Slurred speech [ ];  Neuro: Headaches[ ]; Vertigo[ ]; Seizures[ ]; Paresthesias[ ];Blurred vision [ ]; Diplopia [ ]; Vision changes [ ]  Ortho/Skin: Arthritis [ ]; Joint pain [Y ]; Muscle pain [ ]; Joint swelling [ ]; Back Pain [ ]; Rash [ ]  Psych: Depression[ ]; Anxiety[ ]  Heme: Bleeding problems [ ]; Clotting disorders [ ]; Anemia [ ]  Endocrine: Diabetes [ ]; Thyroid dysfunction[ ]  Home Medications Prior to Admission medications   Medication Sig Start Date End Date Taking? Authorizing Provider  albuterol (PROVENTIL HFA;VENTOLIN HFA) 108 (90 BASE) MCG/ACT inhaler Inhale 2 puffs into the lungs every 6 (six) hours as needed for wheezing (wheezing).     Yes Historical Provider, MD    Past Medical History: Past Medical History  Diagnosis Date  . Hypertension   . Lumbar disc disease   . Morbidly obese   . Cardiomyopathy     Past Surgical History: Past Surgical History  Procedure Laterality Date  . Lumbar laminectomy      Family History: Family History  Problem Relation Age of Onset  . Diabetes Father   . Diabetes Mother   . Obesity Sister     Social History: History   Social History  . Marital Status: Single    Spouse Name: N/A    Number of Children: N/A  . Years of Education: N/A   Social History Main Topics  . Smoking status: Former Smoker -- 1.00 packs/day    Types: Cigarettes    Quit date: 06/21/2013  . Smokeless tobacco: None     Comment: uses ecig  . Alcohol Use: Yes  . Drug Use: No  . Sexual  Activity: None   Other Topics Concern  . None   Social History Narrative  . None    Allergies:  No Known Allergies  Objective:    Vital Signs:   Temp:  [97.2 F (36.2 C)-98.1 F (36.7 C)] 97.8 F (36.6 C) (11/03 0400) Pulse Rate:  [64-101] 69 (11/03 0500) Resp:  [14-24] 14 (11/03 0500) BP: (106-140)/(59-95) 107/78 mmHg (11/03 0454) SpO2:  [91 %-99 %] 96 % (11/03 0500) FiO2 (%):  [40 %] 40 % (11/03 0454) Weight:  [308 lb 13.8 oz (140.1 kg)-310 lb 10.1 oz (140.9 kg)] 308 lb 13.8 oz (140.1 kg) (11/03 0500) Last BM Date: 07/22/13  Weight change: Filed Weights   07/23/13 0631 07/23/13 0758 07/24/13 0500  Weight: 310 lb 10.1 oz (140.9 kg) 310 lb 10.1 oz (140.9 kg) 308 lb 13.8 oz (140.1 kg)    Intake/Output:   Intake/Output Summary (Last 24 hours) at 07/24/13 0717 Last data filed at 07/24/13 0400  Gross per 24 hour  Intake    703 ml  Output   1925 ml  Net  -1222 ml     Physical Exam: General:  Fatigued  appearing. No resp difficulty HEENT: normal Neck: supple. JVP difficult to assess due to body habitus Carotids 2+ bilat; no bruits. No lymphadenopathy or thryomegaly appreciated. Cor: PMI nondisplaced. Regular rate & rhythm. No rubs,  or murmurs. +S3 Lungs: clear Abdomen: Obese, soft, nontender, nondistended. No hepatosplenomegaly. No bruits or masses. Good bowel sounds. Extremities: no cyanosis, clubbing, rash, R and LLE trace edema Neuro: alert & orientedx3, cranial nerves grossly intact. moves all 4 extremities w/o difficulty. Affect pleasant  Telemetry: SR 90s   Labs: Basic Metabolic Panel:  Recent Labs Lab 07/23/13 0101 07/23/13 1500 07/24/13 0415  NA 137 136 138  K 4.1 4.1 4.3  CL 101 98 99  CO2 24 27 28  GLUCOSE 140* 125* 97  BUN 14 13 13  CREATININE 0.83 0.77 0.77  CALCIUM 9.6 9.3 9.3  MG  --   --  2.1    Liver Function Tests:  Recent Labs Lab 07/23/13 1500  AST 23  ALT 27  ALKPHOS 65  BILITOT 0.7  PROT 7.2  ALBUMIN 3.9   No  results found for this basename: LIPASE, AMYLASE,  in the last 168 hours No results found for this basename: AMMONIA,  in the last 168 hours  CBC:  Recent Labs Lab 07/23/13 0101 07/24/13 0415  WBC 11.2* 11.5*  NEUTROABS 8.3*  --   HGB 15.8 15.3    HCT 47.3 47.6  MCV 86.0 87.2  PLT 366 395    Cardiac Enzymes:  Recent Labs Lab 07/23/13 1300 07/23/13 1838 07/23/13 2355  TROPONINI <0.30 <0.30 <0.30    BNP: BNP (last 3 results)  Recent Labs  07/23/13 0215  PROBNP 3783.0*    CBG: No results found for this basename: GLUCAP,  in the last 168 hours  Coagulation Studies: No results found for this basename: LABPROT, INR,  in the last 72 hours  Other results: EKG: ST 120  Imaging: Dg Chest 2 View  07/22/2013   CLINICAL DATA:  Shortness of breath for 1 hr since feeling a pop and a hot sensation at the right side of is lower chest, history COPD, former smoker, hypertension  EXAM: CHEST  2 VIEW  COMPARISON:  03/26/2013  FINDINGS: Enlargement of cardiac silhouette with pulmonary vascular congestion.  Question medial right basilar infiltrate, markings slightly increased versus previous study.  Mild chronic bronchitic changes.  Remaining lungs clear.  No pleural effusion or pneumothorax.  Bones demineralized.  IMPRESSION: Cancer congestion  Bronchitic changes with question medial right basilar infiltrate.   Electronically Signed   By: Mark  Boles M.D.   On: 07/22/2013 23:51   Ct Angio Chest Pe W/cm &/or Wo Cm  07/23/2013   CLINICAL DATA:  Shortness of breath.  EXAM: CT ANGIOGRAPHY CHEST WITH CONTRAST  TECHNIQUE: Multidetector CT imaging of the chest was performed using the standard protocol during bolus administration of intravenous contrast. Multiplanar CT image reconstructions including MIPs were obtained to evaluate the vascular anatomy.  CONTRAST:  100mL OMNIPAQUE IOHEXOL 350 MG/ML SOLN  COMPARISON:  03/26/2013  FINDINGS: THORACIC INLET/BODY WALL:  No acute abnormality.  MEDIASTINUM:   Cardiomegaly, with the left ventricle particularly dilated. Extensive coronary artery atherosclerosis, age advanced. No pericardial effusion. No pulmonary embolism seen. Sensitivity decreased beyond the segmental pulmonary artery at multiple levels due to respiratory motion. Mild lymphadenopathy, with interval increase likely congestive.  LUNG WINDOWS:  Diffuse interlobular septal thickening with patchy ground-glass opacities small, layering bilateral pleural effusions. There is a diffuse bronchial wall thickening which is likely interstitial/bronchovascular edema. No asymmetric opacity to suggest superimposed consolidative pneumonia.  UPPER ABDOMEN:  No acute findings.  OSSEOUS:  No acute fracture.  No suspicious lytic or blastic lesions.  Review of the MIP images confirms the above findings.  IMPRESSION: 1. CHF. 2. Cardiomegaly and extensive, age advanced coronary artery atherosclerosis. 3. Negative for pulmonary embolism to the level of the segmental arteries.   Electronically Signed   By: Jonathan  Watts M.D.   On: 07/23/2013 03:18      Medications:     Current Medications: . aspirin EC  325 mg Oral Daily  . carvedilol  3.125 mg Oral BID WC  . enoxaparin (LOVENOX) injection  40 mg Subcutaneous Q24H  . furosemide  40 mg Intravenous Q12H  . lisinopril  10 mg Oral Daily  . morphine  4 mg Intravenous Once  . potassium chloride  10 mEq Oral BID  . sodium chloride  3 mL Intravenous Q12H     Infusions:      Assessment:   1. Acute Systolic Heart Failure- 07/23/13 ECHO EF 20%  2. HTN 3. Obesity 4. OSA- CPAP 5. Former Smoker - Quit 1 month ago 6. R rib pain   Plan/Discussion:   Mr Swofford is 41 year old with newly diagnosed acute systolic heart failure EF 20%. Discussed possible etiologies of ischemic vs. NICM.   Plan for LHC/RHC this am .   Continue current dose of carvedilol, lisinopril, and lasix and adjust after cath. Renal stable.   Will check SPEP and UPEP.   Consult cardiac  rehab and dietitian. Will need to check oxygen sats with ambulation.   Length of Stay: 1  CLEGG,AMYNP-C 07/24/2013, 7:17 AM  Advanced Heart Failure Team Pager [phone] (M-F; 7a - 4p)  Please contact [facility] for night-coverage after hours (4p -7a ) and weekends on [email or web address]   Patient seen and examined with Amy Clegg, NP. We discussed all aspects of the encounter. I agree with the assessment and plan as stated above. Volume status improved. Echo looks like NICM but has multiple CRFs plan RHC/LHC today to further sort out and assess pressures/outputs.   Lilou Kneip,MD 8:31 AM    

## 2013-07-24 NOTE — Progress Notes (Signed)
CARDIAC REHAB PHASE I   PRE:  Rate/Rhythm: 103ST  BP:  Supine: 98/68  Sitting:   Standing:    SaO2: 91%2L,88%RA, 92%2L  MODE:  Ambulation: 350 ft   POST:  Rate/Rhythm: 115 ST  BP:  Supine:   Sitting: 103/63  Standing:    SaO2: 92%2L 1405-1450 Pt walked 350 ft on 2L with steady gait. Has pain on right side when changing position but not when walking. Tried RA prior to walk but desat. Tolerated walk well. Could have walked farther but needed to use bathroom. Gave pt CHF booklet and discussed importance of daily weights. Pt has scales at home. Encouraged pt to look at booklet and low sodium diet. We will continue to see pt. No DOE with walk.   Luetta Nutting, RN BSN  07/24/2013 2:44 PM

## 2013-07-24 NOTE — Progress Notes (Signed)
0840 Came to see pt for ambulation. RN states pt getting ready to go to cath now. Will continue to follow.  Duanne Limerick, RN BSN 8:41 AM 07/24/2013

## 2013-07-24 NOTE — CV Procedure (Signed)
Cardiac Cath Procedure Note:  Indication:   Procedures performed:  1) Selective coronary angiography 2) Left heart catheterization 3) Left ventriculogram 4) Right heart cath  Description of procedure:   The risks and indication of the procedure were explained. Consent was signed and placed on the chart. An appropriate timeout was taken prior to the procedure. After a normal Allen's test was confirmed, the right wrist was prepped and draped in the routine sterile fashion and anesthetized with 1% local lidocaine.   A micropuncture wire was inserted into the pre-existing R antecubital PIV. A %FR venous sheath was placed over a wire. A 5FR swan was manipulated into the right hear using a 0.14 guidewire.   A 5 FR arterial sheath was then placed in the right radial artery using a modified Seldinger technique. Systemic heparin was administered. 3mg  IV verapamil was given through the sheath. Standard catheters including a JL 3.5, JR4 and straight pigtail were used. All catheter exchanges were made over a wire.  Complications:  None apparent  Findings:  RA: 12 RV:  56/18/19 PA:  54/20 (40) PCWP: 35 Fick CO/CI: 6.6/2.6 PVR: 0.9 Woods Ao Sat: 89% PA sats: 61% 64%  Ao Pressure: 109/83 (89) LV Pressure:  118/23/36  There was no signficant gradient across the aortic valve on pullback.  Left main: Calcified 20% stenosis  LAD: Gives off 2 diagonals. 30% proximal lesion distal vessel tapers with moderate diffuse plaque. No high grade lesions.   LCX: Nondominant. 30% prox to mid lesion. Small OM-1. Large OM-2. 40% prox OM-2  RCA: Dominant vessel. 40% tubular lesion in midsection. Large RV branch with 50% ostial lesion. . Large PDA.   LV-gram done in the RAO projection: Markedly dilated. Severe global HK Ejection fraction = 20-25%  Assessment: 1. Moderate non-obstructive CAD 2. Severe LV dysfunction due to nonischemic CM 3. Moderate pulmonary venous HTN 4. Markedly elevated left sided  pressures with normal cardiac output  Plan/Discussion:  He has severe NICM. Continue diuresis. Titrate HF meds. Treat OSA.  Daniel Bensimhon 10:23 AM

## 2013-07-24 NOTE — Progress Notes (Signed)
Advanced Heart Failure Team Consult Note  Referring Physician: Dr Donnie Aho  Primary Physician: Primary Cardiologist:  None   Reason for Consultation: New Diagnosis Heart Failure   HPI:    Stephen is a 42  year old male with history of HTN, obesity, former smoker quit 2010- 1ppd,  OSA CPAP and a lumbar surgery for ruptured disk with no known coronary disease referred to the HF team by Dr Donnie Aho due to new diagnosis of acute systolic heart failure with severely reduced EF 20%.  In 2010 he says he lost 100 pounds with hcg but has since gained it all back.   Evaluated [facility] ED 03/2013 with . + Increased dyspnea. CT of chest negative for PE. Oxygen dropped to 80s. He was given breathing treatments ans later discharged that day with recommendations for pulmonary follow up. He did not follow up.   Uses CPAP nightly at home. He does not have PCP.SOB walking up steps.  A week ago he says he had pain in his R side.   Does not exercise. Works full time in IT  He presented to [facility] ED 07/23/13 with R upper back and side pain. + progressive dyspnea on exertion. Hypoxic on arrival and placed on short term BiPap. Admit Pro BNP 3783, CEs negative, K 4.1 , TSH 2.2, 11.2 , Hemoglobin 15.3. He placed intermittent IV lasix 40 mg every 12 hours.  ECHO revealed EF 20%,moderately to severely dilated LV and mildly dilated RV.  CT of chest negative for PE but with advanced atheroscerosis. Cardiology was consulted and he was started on carvedilol ECHO revealed EF 20%,moderately to severely dilated LV and mildly dilated RV.     Denies SOB. + Orthopnea.   SH: Lives with Mom, Dad and daughter. Quit smoking 1 month ago   FH: Mom MI x 2 and Heart Failure DM         Father prostate cancer DM    Review of Systems: [y] = yes, [ ]  = no   General: Weight gain [Y ]; Weight loss [ ] ; Anorexia [ ] ; Fatigue [Y ]; Fever [ ] ; Chills [ ] ; Weakness [ ]   Cardiac: Chest pain/pressure [ ] ; Resting SOB [ ] ; Exertional SOB [ Y]; Orthopnea [Y ];  Pedal Edema [ ] ; Palpitations [ ] ; Syncope [ ] ; Presyncope [ ] ; Paroxysmal nocturnal dyspnea[ ]   Pulmonary: Cough [ ] ; Wheezing[ ] ; Hemoptysis[ ] ; Sputum [ ] ; Snoring [ ]   GI: Vomiting[ ] ; Dysphagia[ ] ; Melena[ ] ; Hematochezia [ ] ; Heartburn[ ] ; Abdominal pain [ ] ; Constipation [ ] ; Diarrhea [ ] ; BRBPR [ ]   GU: Hematuria[ ] ; Dysuria [ ] ; Nocturia[ ]   Vascular: Pain in legs with walking [ ] ; Pain in feet with lying flat [ ] ; Non-healing sores [ ] ; Stroke [ ] ; TIA [ ] ; Slurred speech [ ] ;  Neuro: Headaches[ ] ; Vertigo[ ] ; Seizures[ ] ; Paresthesias[ ] ;Blurred vision [ ] ; Diplopia [ ] ; Vision changes [ ]   Ortho/Skin: Arthritis [ ] ; Joint pain [Y ]; Muscle pain [ ] ; Joint swelling [ ] ; Back Pain [ ] ; Rash [ ]   Psych: Depression[ ] ; Anxiety[ ]   Heme: Bleeding problems [ ] ; Clotting disorders [ ] ; Anemia [ ]   Endocrine: Diabetes [ ] ; Thyroid dysfunction[ ]   Home Medications Prior to Admission medications   Medication Sig Start Date End Date Taking? Authorizing Provider  albuterol (PROVENTIL HFA;VENTOLIN HFA) 108 (90 BASE) MCG/ACT inhaler Inhale 2 puffs into the lungs every 6 (six) hours as needed for wheezing (wheezing).  Yes Historical Provider, MD    Past Medical History: Past Medical History  Diagnosis Date  . Hypertension   . Lumbar disc disease   . Morbidly obese   . Cardiomyopathy     Past Surgical History: Past Surgical History  Procedure Laterality Date  . Lumbar laminectomy      Family History: Family History  Problem Relation Age of Onset  . Diabetes Father   . Diabetes Mother   . Obesity Sister     Social History: History   Social History  . Marital Status: Single    Spouse Name: N/A    Number of Children: N/A  . Years of Education: N/A   Social History Main Topics  . Smoking status: Former Smoker -- 1.00 packs/day    Types: Cigarettes    Quit date: 06/21/2013  . Smokeless tobacco: None     Comment: uses ecig  . Alcohol Use: Yes  . Drug Use: No  . Sexual  Activity: None   Other Topics Concern  . None   Social History Narrative  . None    Allergies:  No Known Allergies  Objective:    Vital Signs:   Temp:  [97.2 F (36.2 C)-98.1 F (36.7 C)] 97.8 F (36.6 C) (11/03 0400) Pulse Rate:  [64-101] 69 (11/03 0500) Resp:  [14-24] 14 (11/03 0500) BP: (106-140)/(59-95) 107/78 mmHg (11/03 0454) SpO2:  [91 %-99 %] 96 % (11/03 0500) FiO2 (%):  [40 %] 40 % (11/03 0454) Weight:  [308 lb 13.8 oz (140.1 kg)-310 lb 10.1 oz (140.9 kg)] 308 lb 13.8 oz (140.1 kg) (11/03 0500) Last BM Date: 07/22/13  Weight change: Filed Weights   07/23/13 0631 07/23/13 0758 07/24/13 0500  Weight: 310 lb 10.1 oz (140.9 kg) 310 lb 10.1 oz (140.9 kg) 308 lb 13.8 oz (140.1 kg)    Intake/Output:   Intake/Output Summary (Last 24 hours) at 07/24/13 0717 Last data filed at 07/24/13 0400  Gross per 24 hour  Intake    703 ml  Output   1925 ml  Net  -1222 ml     Physical Exam: General:  Fatigued  appearing. No resp difficulty HEENT: normal Neck: supple. JVP difficult to assess due to body habitus Carotids 2+ bilat; no bruits. No lymphadenopathy or thryomegaly appreciated. Cor: PMI nondisplaced. Regular rate & rhythm. No rubs,  or murmurs. +S3 Lungs: clear Abdomen: Obese, soft, nontender, nondistended. No hepatosplenomegaly. No bruits or masses. Good bowel sounds. Extremities: no cyanosis, clubbing, rash, R and LLE trace edema Neuro: alert & orientedx3, cranial nerves grossly intact. moves all 4 extremities w/o difficulty. Affect pleasant  Telemetry: SR 90s   Labs: Basic Metabolic Panel:  Recent Labs Lab 07/23/13 0101 07/23/13 1500 07/24/13 0415  NA 137 136 138  K 4.1 4.1 4.3  CL 101 98 99  CO2 24 27 28   GLUCOSE 140* 125* 97  BUN 14 13 13   CREATININE 0.83 0.77 0.77  CALCIUM 9.6 9.3 9.3  MG  --   --  2.1    Liver Function Tests:  Recent Labs Lab 07/23/13 1500  AST 23  ALT 27  ALKPHOS 65  BILITOT 0.7  PROT 7.2  ALBUMIN 3.9   No  results found for this basename: LIPASE, AMYLASE,  in the last 168 hours No results found for this basename: AMMONIA,  in the last 168 hours  CBC:  Recent Labs Lab 07/23/13 0101 07/24/13 0415  WBC 11.2* 11.5*  NEUTROABS 8.3*  --   HGB 15.8 15.3  HCT 47.3 47.6  MCV 86.0 87.2  PLT 366 395    Cardiac Enzymes:  Recent Labs Lab 07/23/13 1300 07/23/13 1838 07/23/13 2355  TROPONINI <0.30 <0.30 <0.30    BNP: BNP (last 3 results)  Recent Labs  07/23/13 0215  PROBNP 3783.0*    CBG: No results found for this basename: GLUCAP,  in the last 168 hours  Coagulation Studies: No results found for this basename: LABPROT, INR,  in the last 72 hours  Other results: EKG: ST 120  Imaging: Dg Chest 2 View  07/22/2013   CLINICAL DATA:  Shortness of breath for 1 hr since feeling a pop and a hot sensation at the right side of is lower chest, history COPD, former smoker, hypertension  EXAM: CHEST  2 VIEW  COMPARISON:  03/26/2013  FINDINGS: Enlargement of cardiac silhouette with pulmonary vascular congestion.  Question medial right basilar infiltrate, markings slightly increased versus previous study.  Mild chronic bronchitic changes.  Remaining lungs clear.  No pleural effusion or pneumothorax.  Bones demineralized.  IMPRESSION: Cancer congestion  Bronchitic changes with question medial right basilar infiltrate.   Electronically Signed   By: Ulyses Southward M.D.   On: 07/22/2013 23:51   Ct Angio Chest Pe W/cm &/or Wo Cm  07/23/2013   CLINICAL DATA:  Shortness of breath.  EXAM: CT ANGIOGRAPHY CHEST WITH CONTRAST  TECHNIQUE: Multidetector CT imaging of the chest was performed using the standard protocol during bolus administration of intravenous contrast. Multiplanar CT image reconstructions including MIPs were obtained to evaluate the vascular anatomy.  CONTRAST:  OMNIPAQUE IOHEXOL 350 MG/ML SOLN  COMPARISON:  03/26/2013  FINDINGS: THORACIC INLET/BODY WALL:  No acute abnormality.  MEDIASTINUM:   Cardiomegaly, with the left ventricle particularly dilated. Extensive coronary artery atherosclerosis, age advanced. No pericardial effusion. No pulmonary embolism seen. Sensitivity decreased beyond the segmental pulmonary artery at multiple levels due to respiratory motion. Mild lymphadenopathy, with interval increase likely congestive.  LUNG WINDOWS:  Diffuse interlobular septal thickening with patchy ground-glass opacities small, layering bilateral pleural effusions. There is a diffuse bronchial wall thickening which is likely interstitial/bronchovascular edema. No asymmetric opacity to suggest superimposed consolidative pneumonia.  UPPER ABDOMEN:  No acute findings.  OSSEOUS:  No acute fracture.  No suspicious lytic or blastic lesions.  Review of the MIP images confirms the above findings.  IMPRESSION: 1. CHF. 2. Cardiomegaly and extensive, age advanced coronary artery atherosclerosis. 3. Negative for pulmonary embolism to the level of the segmental arteries.   Electronically Signed   By: Tiburcio Pea M.D.   On: 07/23/2013 03:18      Medications:     Current Medications: . aspirin EC  325 mg Oral Daily  . carvedilol  3.125 mg Oral BID WC  . enoxaparin (LOVENOX) injection  40 mg Subcutaneous Q24H  . furosemide  40 mg Intravenous Q12H  . lisinopril  10 mg Oral Daily  . morphine  4 mg Intravenous Once  . potassium chloride  10 mEq Oral BID  . sodium chloride  3 mL Intravenous Q12H     Infusions:      Assessment:   1. Acute Systolic Heart Failure- 07/23/13 ECHO EF 20%  2. HTN 3. Obesity 4. OSA- CPAP 5. Former Smoker - Quit 1 month ago 6. R rib pain   Plan/Discussion:   Mr Kane is 42 year old with newly diagnosed acute systolic heart failure EF 20%. Discussed possible etiologies of ischemic vs. NICM.   Plan for LHC/RHC this am .  Continue current dose of carvedilol, lisinopril, and lasix and adjust after cath. Renal stable.   Will check SPEP and UPEP.   Consult cardiac  rehab and dietitian. Will need to check oxygen sats with ambulation.   Length of Stay: 1  CLEGG,AMYNP-C 07/24/2013, 7:17 AM  Advanced Heart Failure Team Pager [phone] (M-F; 7a - 4p)  Please contact  Cardiology for night-coverage after hours (4p -7a ) and weekends on [email or web address]   Patient seen and examined with Tonye Becket, NP. We discussed all aspects of the encounter. I agree with the assessment and plan as stated above. Volume status improved. Echo looks like NICM but has multiple CRFs plan RHC/LHC today to further sort out and assess pressures/outputs.   Truman Hayward 8:31 AM

## 2013-07-24 NOTE — Interval H&P Note (Signed)
History and Physical Interval Note:  07/24/2013 10:22 AM  Stephen Kane  has presented today for surgery, with the diagnosis of heart failureCath Lab Visit (complete for each Cath Lab visit)  Clinical Evaluation Leading to the Procedure:   ACS: no  Non-ACS:    Anginal Classification: CCS IV  Anti-ischemic medical therapy: Minimal Therapy (1 class of medications)  Non-Invasive Test Results: No non-invasive testing performed  Prior CABG: No previous CABG        The various methods of treatment have been discussed with the patient and family. After consideration of risks, benefits and other options for treatment, the patient has consented to  Procedure(s): LEFT AND RIGHT HEART CATHETERIZATION WITH CORONARY ANGIOGRAM (N/A) as a surgical intervention .  The patient's history has been reviewed, patient examined, no change in status, stable for surgery.  I have reviewed the patient's chart and labs.  Questions were answered to the patient's satisfaction.     Daniel Bensimhon

## 2013-07-24 NOTE — Progress Notes (Signed)
[facility] PROGRESS NOTE  Stephen Kane ZOX:[id number] DOB: 1971-08-17 DOA: 07/23/2013 PCP: No PCP Per Patient  Assessment/Plan: Acute systolic/Diastolic CHF- New, CHF Protocol ordered,  Diurese with IV lasix,  2-D ECHO ordered, and Initiate Carvedilol, and Lisinopril Rx.  Troponin's neg, and TSH.  Cath:  1. Moderate non-obstructive CAD  2. Severe LV dysfunction due to nonischemic CM  3. Moderate pulmonary venous HTN  4. Markedly elevated left sided pressures with normal cardiac output  Respiratory Distress- Due to #1, Placed on BiPAP.    Chest pain- Cycle troponins. CTA of chest Negative for PE or dissection.   HTN- Monitor BPs, Started carvedilol, and Lisinopril rx  Morbid Obesity- Needs Weight Loss  OSA- bipap at night  Code Status: full Family Communication: patient Disposition Plan:    Consultants:  cards  Procedures:    Antibiotics:    HPI/Subjective: Feeling better No SOB  Objective: Filed Vitals:   07/24/13 1057  BP: 142/90  Pulse: 100  Temp:   Resp: 23    Intake/Output Summary (Last 24 hours) at 07/24/13 1138 Last data filed at 07/24/13 0400  Gross per 24 hour  Intake    323 ml  Output   1375 ml  Net  -1052 ml   Filed Weights   07/23/13 0631 07/23/13 0758 07/24/13 0500  Weight: 140.9 kg (310 lb 10.1 oz) 140.9 kg (310 lb 10.1 oz) 140.1 kg (308 lb 13.8 oz)    Exam:    General appearance: Pleasant obese white male in no acute distress  Lungs: clear to auscultation bilaterally  Heart: Regular rate and rhythm, S4 present, no definite S3 heard, no murmur  Abdomen: soft, non-tender; bowel sounds normal; no masses, no organomegaly  Extremities: Trace edema, extremities are well perfused    Data Reviewed: Basic Metabolic Panel:  Recent Labs Lab 07/23/13 0101 07/23/13 1500 07/24/13 0415  NA 137 136 138  K 4.1 4.1 4.3  CL 101 98 99  CO2 24 27 28   GLUCOSE 140* 125* 97  BUN 14 13 13   CREATININE 0.83 0.77 0.77  CALCIUM 9.6  9.3 9.3  MG  --   --  2.1   Liver Function Tests:  Recent Labs Lab 07/23/13 1500  AST 23  ALT 27  ALKPHOS 65  BILITOT 0.7  PROT 7.2  ALBUMIN 3.9   No results found for this basename: LIPASE, AMYLASE,  in the last 168 hours No results found for this basename: AMMONIA,  in the last 168 hours CBC:  Recent Labs Lab 07/23/13 0101 07/24/13 0415 07/24/13 0845  WBC 11.2* 11.5* 12.0*  NEUTROABS 8.3*  --   --   HGB 15.8 15.3 15.1  HCT 47.3 47.6 46.8  MCV 86.0 87.2 88.5  PLT 366 395 369   Cardiac Enzymes:  Recent Labs Lab 07/23/13 1300 07/23/13 1838 07/23/13 2355  TROPONINI <0.30 <0.30 <0.30   BNP (last 3 results)  Recent Labs  07/23/13 0215  PROBNP 3783.0*   CBG: No results found for this basename: GLUCAP,  in the last 168 hours  Recent Results (from the past 240 hour(s))  MRSA PCR SCREENING     Status: None   Collection Time    07/23/13  6:23 AM      Result Value Range Status   MRSA by PCR NEGATIVE  NEGATIVE Final   Comment:            The GeneXpert MRSA Assay (FDA     approved for NASAL specimens  only), is one component of a     comprehensive MRSA colonization     surveillance program. It is not     intended to diagnose MRSA     infection nor to guide or     monitor treatment for     MRSA infections.     Studies: Dg Chest 2 View  07/22/2013   CLINICAL DATA:  Shortness of breath for 1 hr since feeling a pop and a hot sensation at the right side of is lower chest, history COPD, former smoker, hypertension  EXAM: CHEST  2 VIEW  COMPARISON:  03/26/2013  FINDINGS: Enlargement of cardiac silhouette with pulmonary vascular congestion.  Question medial right basilar infiltrate, markings slightly increased versus previous study.  Mild chronic bronchitic changes.  Remaining lungs clear.  No pleural effusion or pneumothorax.  Bones demineralized.  IMPRESSION: Cancer congestion  Bronchitic changes with question medial right basilar infiltrate.   Electronically  Signed   By: Ulyses Southward M.D.   On: 07/22/2013 23:51   Ct Angio Chest Pe W/cm &/or Wo Cm  07/23/2013   CLINICAL DATA:  Shortness of breath.  EXAM: CT ANGIOGRAPHY CHEST WITH CONTRAST  TECHNIQUE: Multidetector CT imaging of the chest was performed using the standard protocol during bolus administration of intravenous contrast. Multiplanar CT image reconstructions including MIPs were obtained to evaluate the vascular anatomy.  CONTRAST:  OMNIPAQUE IOHEXOL 350 MG/ML SOLN  COMPARISON:  03/26/2013  FINDINGS: THORACIC INLET/BODY WALL:  No acute abnormality.  MEDIASTINUM:  Cardiomegaly, with the left ventricle particularly dilated. Extensive coronary artery atherosclerosis, age advanced. No pericardial effusion. No pulmonary embolism seen. Sensitivity decreased beyond the segmental pulmonary artery at multiple levels due to respiratory motion. Mild lymphadenopathy, with interval increase likely congestive.  LUNG WINDOWS:  Diffuse interlobular septal thickening with patchy ground-glass opacities small, layering bilateral pleural effusions. There is a diffuse bronchial wall thickening which is likely interstitial/bronchovascular edema. No asymmetric opacity to suggest superimposed consolidative pneumonia.  UPPER ABDOMEN:  No acute findings.  OSSEOUS:  No acute fracture.  No suspicious lytic or blastic lesions.  Review of the MIP images confirms the above findings.  IMPRESSION: 1. CHF. 2. Cardiomegaly and extensive, age advanced coronary artery atherosclerosis. 3. Negative for pulmonary embolism to the level of the segmental arteries.   Electronically Signed   By: Tiburcio Pea M.D.   On: 07/23/2013 03:18    Scheduled Meds: . aspirin EC  325 mg Oral Daily  . carvedilol  3.125 mg Oral BID WC  . enoxaparin (LOVENOX) injection  40 mg Subcutaneous Q24H  . furosemide  80 mg Intravenous BID  . lisinopril  10 mg Oral Daily  . morphine  4 mg Intravenous Once  . potassium chloride  10 mEq Oral BID  . sodium  chloride  3 mL Intravenous Q12H   Continuous Infusions: . sodium chloride 50 mL/hr at 07/24/13 1056    Principal Problem:   Flank pain Active Problems:   Acute on chronic systolic congestive heart failure   Respiratory distress   Hypertensive heart disease   Congestive dilated cardiomyopathy   Morbid obesity   Acute systolic heart failure    Time spent: 35 min    Shelsie Tijerino  [facility] Pager [phone] If 7PM-7AM, please contact night-coverage at [email or web address], password [facility] 07/24/2013, 11:38 AM  LOS: 1 day

## 2013-07-24 NOTE — Plan of Care (Signed)
Problem: Food- and Nutrition-Related Knowledge Deficit (NB-1.1) Goal: Nutrition education Formal process to instruct or train a patient/client in a skill or to impart knowledge to help patients/clients voluntarily manage or modify food choices and eating behavior to maintain or improve health. Outcome: Progressing Nutrition Education Note  RD consulted for nutrition education regarding new onset CHF.  RD provided "Low Sodium Nutrition Therapy" handout from the Academy of [employer]. Pt has just returned from Cath Lab.  He is still somewhat groggy but engaged in conversation. Discouraged intake of processed foods and use of salt shaker. RD discussed why it is important for patient to adhere to diet recommendations, and emphasized the role of fluids, foods to avoid, and importance of weighing self daily. Teach back method used.  Expect good compliance.  Body mass index is 44.32 kg/(m^2). Pt meets criteria for morbidly obese based on current BMI.  Current diet order is Heart Healthy, patient is consuming approximately 25-100% of meals at this time. Labs and medications reviewed. No further nutrition interventions warranted at this time. RD contact information provided. If additional nutrition issues arise, please re-consult RD.   RD instructed pt to request return visit once well-rested if further questions or concerns present.   Loyce Dys, MS RD LDN Clinical Inpatient Dietitian Pager: [phone] Weekend/After hours pager: [phone]

## 2013-07-24 NOTE — Plan of Care (Signed)
Problem: Phase I Progression Outcomes Goal: EF % per last Echo/documented,Core Reminder form on chart Outcome: Completed/Met Date Met:  07/24/13 EF 20% per echo on 07/23/13 Goal: Up in chair, BRP Outcome: Completed/Met Date Met:  07/24/13 Pt OOB to bathroom and chair ad lib. Pt steady on feet.   Problem: Phase II Progression Outcomes Goal: Dyspnea controlled with activity Outcome: Completed/Met Date Met:  07/24/13 Pt ad lib in room, pt with no complaints of shortness of breath with activity

## 2013-07-25 LAB — BASIC METABOLIC PANEL
BUN: 16 mg/dL (ref 6–23)
CO2: 30 mEq/L (ref 19–32)
Creatinine, Ser: 0.77 mg/dL (ref 0.50–1.35)
GFR calc non Af Amer: >90 mL/min (ref >90)
Glucose, Bld: 90 mg/dL (ref 70–99)
Potassium: 3.9 mEq/L (ref 3.5–5.1)

## 2013-07-25 LAB — CBC
HCT: 46.9 % (ref 39.0–52.0)
Hemoglobin: 15.6 g/dL (ref 13.0–17.0)
MCH: 28.8 pg (ref 26.0–34.0)
MCHC: 33.3 g/dL (ref 30.0–36.0)
MCV: 86.7 fL (ref 78.0–100.0)
RBC: 5.41 MIL/uL (ref 4.22–5.81)
RDW: 14.8 % (ref 11.5–15.5)

## 2013-07-25 MED ORDER — SPIRONOLACTONE 12.5 MG HALF TABLET
12.5000 mg | ORAL_TABLET | Freq: Every day | ORAL | Status: DC
  Administered 2013-07-25 – 2013-07-26 (×2): 12.5 mg via ORAL
  Filled 2013-07-25 (×2): qty 1

## 2013-07-25 MED ORDER — ATORVASTATIN CALCIUM 40 MG PO TABS
40.0000 mg | ORAL_TABLET | Freq: Every day | ORAL | Status: DC
  Administered 2013-07-25: 40 mg via ORAL
  Filled 2013-07-25 (×2): qty 1

## 2013-07-25 MED ORDER — ASPIRIN EC 81 MG PO TBEC
81.0000 mg | DELAYED_RELEASE_TABLET | Freq: Every day | ORAL | Status: DC
  Administered 2013-07-26: 11:00:00 81 mg via ORAL
  Filled 2013-07-25: qty 1

## 2013-07-25 MED ORDER — DIGOXIN 125 MCG PO TABS
0.1250 mg | ORAL_TABLET | Freq: Every day | ORAL | Status: DC
  Administered 2013-07-25: 0.125 mg via ORAL
  Filled 2013-07-25 (×2): qty 1

## 2013-07-25 NOTE — Progress Notes (Signed)
CARDIAC REHAB PHASE I   PRE:  Rate/Rhythm: 66SR  BP:  Supine:   Sitting: 110/62  Standing:    SaO2: 92%RA  MODE:  Ambulation: 600 ft   POST:  Rate/Rhythm: 100  BP:  Supine:   Sitting: 112/72  Standing:    SaO2: 92%RA 1342-1435 Pt walked 600 ft on RA with steady gait. Tolerated well. No DOE. Education completed. Understanding voiced. Discussed CRP 2 and pt gave permission to refer to [place]. Congratulated pt on not smoking in month.    Luetta Nutting, RN BSN  07/25/2013 2:32 PM

## 2013-07-25 NOTE — Progress Notes (Signed)
[facility] PROGRESS NOTE  Stephen Kane ZOX:[id number] DOB: 24-Apr-1971 DOA: 07/23/2013 PCP: No PCP Per Patient  Assessment/Plan: Acute systolic/Diastolic CHF- New diagnosis,   Diurese with IV lasix,  Carvedilol, and Lisinopril Rx.  Troponin's neg, and TSH.  Cath:  1. Moderate non-obstructive CAD  2. Severe LV dysfunction due to nonischemic CM  3. Moderate pulmonary venous HTN  4. Markedly elevated left sided pressures with normal cardiac output -RHC 11/4   Respiratory Distress- resolved   Chest pain- CE negative. CTA of chest Negative for PE or dissection.   HTN- Monitor BPs, Started carvedilol, and Lisinopril rx  Morbid Obesity- Needs Weight Loss  OSA- bipap at night  Code Status: full Family Communication: patient Disposition Plan:    Consultants:  cards  Procedures:    Antibiotics:    HPI/Subjective: Weight down, breathing better   Objective: Filed Vitals:   07/25/13 0857  BP: 108/68  Pulse: 82  Temp:   Resp:     Intake/Output Summary (Last 24 hours) at 07/25/13 1032 Last data filed at 07/25/13 0948  Gross per 24 hour  Intake    600 ml  Output   2325 ml  Net  -1725 ml   Filed Weights   07/24/13 0500 07/24/13 1544 07/25/13 0502  Weight: 140.1 kg (308 lb 13.8 oz) 137.8 kg (303 lb 12.7 oz) 135.9 kg (299 lb 9.7 oz)    Exam:    General appearance: Pleasant obese white male in no acute distress  Lungs: clear to auscultation bilaterally  Heart: Regular rate and rhythm, S4 present, no definite S3 heard, no murmur  Abdomen: soft, non-tender; bowel sounds normal; no masses, no organomegaly  Extremities: Trace edema, extremities are well perfused    Data Reviewed: Basic Metabolic Panel:  Recent Labs Lab 07/23/13 0101 07/23/13 1500 07/24/13 0415 07/24/13 0845 07/25/13 0529  NA 137 136 138  --  138  K 4.1 4.1 4.3  --  3.9  CL 101 98 99  --  97  CO2 24 27 28   --  30  GLUCOSE 140* 125* 97  --  90  BUN 14 13 13   --  16   CREATININE 0.83 0.77 0.77 0.84 0.77  CALCIUM 9.6 9.3 9.3  --  9.3  MG  --   --  2.1  --   --    Liver Function Tests:  Recent Labs Lab 07/23/13 1500  AST 23  ALT 27  ALKPHOS 65  BILITOT 0.7  PROT 7.2  ALBUMIN 3.9   No results found for this basename: LIPASE, AMYLASE,  in the last 168 hours No results found for this basename: AMMONIA,  in the last 168 hours CBC:  Recent Labs Lab 07/23/13 0101 07/24/13 0415 07/24/13 0845 07/25/13 0529  WBC 11.2* 11.5* 12.0* 10.1  NEUTROABS 8.3*  --   --   --   HGB 15.8 15.3 15.1 15.6  HCT 47.3 47.6 46.8 46.9  MCV 86.0 87.2 88.5 86.7  PLT 366 395 369 375   Cardiac Enzymes:  Recent Labs Lab 07/23/13 1300 07/23/13 1838 07/23/13 2355  TROPONINI <0.30 <0.30 <0.30   BNP (last 3 results)  Recent Labs  07/23/13 0215  PROBNP 3783.0*   CBG: No results found for this basename: GLUCAP,  in the last 168 hours  Recent Results (from the past 240 hour(s))  MRSA PCR SCREENING     Status: None   Collection Time    07/23/13  6:23 AM      Result Value  Range Status   MRSA by PCR NEGATIVE  NEGATIVE Final   Comment:            The GeneXpert MRSA Assay (FDA     approved for NASAL specimens     only), is one component of a     comprehensive MRSA colonization     surveillance program. It is not     intended to diagnose MRSA     infection nor to guide or     monitor treatment for     MRSA infections.     Studies: No results found.  Scheduled Meds: . aspirin EC  325 mg Oral Daily  . carvedilol  3.125 mg Oral BID WC  . enoxaparin (LOVENOX) injection  40 mg Subcutaneous Q24H  . furosemide  80 mg Intravenous BID  . lisinopril  10 mg Oral Daily  . morphine  4 mg Intravenous Once  . potassium chloride  10 mEq Oral BID  . sodium chloride  3 mL Intravenous Q12H   Continuous Infusions:    Principal Problem:   Flank pain Active Problems:   Acute on chronic systolic congestive heart failure   Respiratory distress   Hypertensive  heart disease   Congestive dilated cardiomyopathy   Morbid obesity   Acute systolic heart failure    Time spent: 35 min    Kristianna Saperstein  [facility] Pager [phone] If 7PM-7AM, please contact night-coverage at [email or web address], password [facility] 07/25/2013, 10:32 AM  LOS: 2 days

## 2013-07-25 NOTE — Progress Notes (Signed)
Co-signed for LeTitia Teasley RN/BSN for assessments, IV assessments, medication administration, progress notes, patient education, I's and O's, and vital signs. Aadan Chenier M, RN/BSN 

## 2013-07-25 NOTE — Progress Notes (Signed)
Advanced Heart Failure Team Consult Note  Referring Physician: Dr Donnie Aho  Primary Physician: Primary Cardiologist:  None   Reason for Consultation: New Diagnosis Heart Failure   HPI:    Stephen Kane is a 42  year old male with history of HTN, obesity, former smoker quit 2010- 1ppd,  OSA CPAP and a lumbar surgery for ruptured disk with no known coronary disease referred to the HF team by Dr Donnie Aho due to new diagnosis of acute systolic heart failure with severely reduced EF 20%.  In 2010 he says he lost 100 pounds with hcg but has since gained it all back.   Evaluated [facility] ED 03/2013 with . + Increased dyspnea. CT of chest negative for PE. Oxygen dropped to 80s. He was given breathing treatments ans later discharged that day with recommendations for pulmonary follow up. He did not follow up.   Uses CPAP nightly at home. He does not have PCP.SOB walking up steps.  A week ago he says he had pain in his R side.   Does not exercise. Works full time in IT  He presented to [facility] ED 07/23/13 with R upper back and side pain. + progressive dyspnea on exertion. Hypoxic on arrival and placed on short term BiPap. Admit Pro BNP 3783, CEs negative, K 4.1 , TSH 2.2, 11.2 , Hemoglobin 15.3. He placed intermittent IV lasix 40 mg every 12 hours.  ECHO revealed EF 20%,moderately to severely dilated LV and mildly dilated RV.  CT of chest negative for PE but with advanced atheroscerosis. Cardiology was consulted and he was started on carvedilol ECHO revealed EF 20%,moderately to severely dilated LV and mildly dilated RV.     Yesterday he had cath as noted below and diuretics increased. Weight down 4 pounds. 24 hour I/O -1.6 . Denies SOB/PND. Uses CPAP last night.   RHC/LHF 07/24/13  RA: 12  RV: 56/18/19  PA: 54/20 (40)  PCWP: 35  Fick CO/CI: 6.6/2.6  PVR: 0.9 [place]  Ao Sat: 89%  PA sats: 61% 64%  Left main: Calcified 20% stenosis  LAD: Gives off 2 diagonals. 30% proximal lesion distal vessel tapers with moderate diffuse  plaque. No high grade lesions.  LCX: Nondominant. 30% prox to mid lesion. Small OM-1. Large OM-2. 40% prox OM-2  RCA: Dominant vessel. 40% tubular lesion in midsection. Large RV branch with 50% ostial lesion. . Large PDA.  LV-gram done in the RAO projection: Markedly dilated. Severe global HK Ejection fraction = 20-25%    SH: Lives with Mom, Dad and daughter. Quit smoking 1 month ago   FH: Mom MI x 2 and Heart Failure DM         Father prostate cancer DM    Home Medications Prior to Admission medications   Medication Sig Start Date End Date Taking? Authorizing Provider  albuterol (PROVENTIL HFA;VENTOLIN HFA) 108 (90 BASE) MCG/ACT inhaler Inhale 2 puffs into the lungs every 6 (six) hours as needed for wheezing (wheezing).   Yes Historical Provider, MD    Past Medical History: Past Medical History  Diagnosis Date  . Hypertension   . Lumbar disc disease   . Morbidly obese   . Cardiomyopathy     Past Surgical History: Past Surgical History  Procedure Laterality Date  . Lumbar laminectomy      Family History: Family History  Problem Relation Age of Onset  . Diabetes Father   . Diabetes Mother   . Obesity Sister     Social History: History   Social  History  . Marital Status: Single    Spouse Name: N/A    Number of Children: N/A  . Years of Education: N/A   Social History Main Topics  . Smoking status: Former Smoker -- 1.00 packs/day    Types: Cigarettes    Quit date: 06/21/2013  . Smokeless tobacco: None     Comment: uses ecig  . Alcohol Use: Yes  . Drug Use: No  . Sexual Activity: None   Other Topics Concern  . None   Social History Narrative  . None    Allergies:  No Known Allergies  Objective:    Vital Signs:   Temp:  [97.3 F (36.3 C)-98.7 F (37.1 C)] 98.2 F (36.8 C) (11/04 0502) Pulse Rate:  [82-105] 82 (11/04 0857) Resp:  [12-23] 20 (11/04 0502) BP: (108-142)/(52-99) 108/68 mmHg (11/04 0857) SpO2:  [91 %-97 %] 92 % (11/04  0502) Weight:  [299 lb 9.7 oz (135.9 kg)-303 lb 12.7 oz (137.8 kg)] 299 lb 9.7 oz (135.9 kg) (11/04 0502) Last BM Date: 07/22/13  Weight change: Filed Weights   07/24/13 0500 07/24/13 1544 07/25/13 0502  Weight: 308 lb 13.8 oz (140.1 kg) 303 lb 12.7 oz (137.8 kg) 299 lb 9.7 oz (135.9 kg)    Intake/Output:   Intake/Output Summary (Last 24 hours) at 07/25/13 1038 Last data filed at 07/25/13 0948  Gross per 24 hour  Intake    600 ml  Output   2325 ml  Net  -1725 ml     Physical Exam: General:  Fatigued  appearing. No resp difficulty Sitting in chair.  HEENT: normal Neck: supple. JVP difficult to assess due to body habitus Carotids 2+ bilat; no bruits. No lymphadenopathy or thryomegaly appreciated. Cor: PMI nondisplaced. Regular rate & rhythm. No rubs,  or murmurs. +S3 Lungs: clear Abdomen: Obese, soft, nontender, nondistended. No hepatosplenomegaly. No bruits or masses. Good bowel sounds. Extremities: no cyanosis, clubbing, rash, no  R and LLE edema Neuro: alert & orientedx3, cranial nerves grossly intact. moves all 4 extremities w/o difficulty. Affect pleasant  Telemetry: SR 90s   Labs: Basic Metabolic Panel:  Recent Labs Lab 07/23/13 0101 07/23/13 1500 07/24/13 0415 07/24/13 0845 07/25/13 0529  NA 137 136 138  --  138  K 4.1 4.1 4.3  --  3.9  CL 101 98 99  --  97  CO2 24 27 28   --  30  GLUCOSE 140* 125* 97  --  90  BUN 14 13 13   --  16  CREATININE 0.83 0.77 0.77 0.84 0.77  CALCIUM 9.6 9.3 9.3  --  9.3  MG  --   --  2.1  --   --     Liver Function Tests:  Recent Labs Lab 07/23/13 1500  AST 23  ALT 27  ALKPHOS 65  BILITOT 0.7  PROT 7.2  ALBUMIN 3.9   No results found for this basename: LIPASE, AMYLASE,  in the last 168 hours No results found for this basename: AMMONIA,  in the last 168 hours  CBC:  Recent Labs Lab 07/23/13 0101 07/24/13 0415 07/24/13 0845 07/25/13 0529  WBC 11.2* 11.5* 12.0* 10.1  NEUTROABS 8.3*  --   --   --   HGB 15.8 15.3  15.1 15.6  HCT 47.3 47.6 46.8 46.9  MCV 86.0 87.2 88.5 86.7  PLT 366 395 369 375    Cardiac Enzymes:  Recent Labs Lab 07/23/13 1300 07/23/13 1838 07/23/13 2355  TROPONINI <0.30 <0.30 <0.30  BNP: BNP (last 3 results)  Recent Labs  07/23/13 0215  PROBNP 3783.0*    CBG: No results found for this basename: GLUCAP,  in the last 168 hours  Coagulation Studies: No results found for this basename: LABPROT, INR,  in the last 72 hours  Other results:  Imaging: No results found.   Medications:     Current Medications: . aspirin EC  325 mg Oral Daily  . carvedilol  3.125 mg Oral BID WC  . enoxaparin (LOVENOX) injection  40 mg Subcutaneous Q24H  . furosemide  80 mg Intravenous BID  . lisinopril  10 mg Oral Daily  . morphine  4 mg Intravenous Once  . potassium chloride  10 mEq Oral BID  . sodium chloride  3 mL Intravenous Q12H    Infusions:     Assessment:   1. Acute Systolic Heart Failure- 07/23/13 ECHO EF 20%  2. HTN 3. Obesity 4. OSA- CPAP 5. Former Smoker - Quit 1 month ago 6. R rib pain   Plan/Discussion:   Mr Bowker is 42 year old with newly diagnosed acute systolic heart failure EF 20%. Discussed possible etiologies of ischemic vs. NICM. NYHA IIIb   Volume improving. Weight down 4 pounds. Continue lasix 80 mg bid and likely transition po lasix   tomorrow. Add 12.5 mg spironolactone daily. Overall weight down 11 pounds. Continue carvedilol 3.125 mg twice a day. Add  0.125 mg digoxin. Continue lisinopril 10 mg daily. Renal function stable. Reinforced daily weights, low slat food choices, limiting fluid intake to < 2 liter per day, and medication compliance.     SPEP and UPEP pending.   Start lipitor 40 mg daily. Cut aspirin back to 81 mg daily. Will need LFTs and lipid panel in 3 months.   Cardiac rehab following.O2 sats on room air. Ok.  Dietitian input appreciated.  Length of Stay: 2  CLEGG,AMYNP-C 07/25/2013, 10:38 AM  Advanced Heart Failure  Team Pager [phone] (M-F; 7a - 4p)  Please contact [facility] for night-coverage after hours (4p -7a ) and weekends on [email or web address]   Patient seen and examined with Tonye Becket, NP. We discussed all aspects of the encounter. I agree with the assessment and plan as stated above. We reviewed cath results. Improving with diuresis. Continue IV lasix for at lest one more day. Suspect he has severe OSA. Will need f/u with Pulmonary soon after d/c. Discussed with patient and family.  Agree with HF med titration as above.   Daniel Bensimhon,MD 10:19 PM

## 2013-07-26 LAB — BASIC METABOLIC PANEL
CO2: 30 mEq/L (ref 19–32)
Calcium: 9.5 mg/dL (ref 8.4–10.5)
Creatinine, Ser: 0.76 mg/dL (ref 0.50–1.35)
GFR calc non Af Amer: >90 mL/min (ref >90)
Glucose, Bld: 81 mg/dL (ref 70–99)
Potassium: 3.4 mEq/L — ABNORMAL LOW (ref 3.5–5.1)
Sodium: 138 mEq/L (ref 135–145)

## 2013-07-26 LAB — IMMUNOFIXATION ELECTROPHORESIS
IgM, Serum: 51 mg/dL — ABNORMAL LOW (ref 41–251)
Total Protein ELP: 6.3 g/dL (ref 6.0–8.3)

## 2013-07-26 LAB — PROTEIN ELECTROPHORESIS, SERUM
Alpha-1-Globulin: 9.2 % — ABNORMAL HIGH (ref 2.9–4.9)
Gamma Globulin: 10.2 % — ABNORMAL LOW (ref 11.1–18.8)
M-Spike, %: NOT DETECTED g/dL

## 2013-07-26 MED ORDER — CARVEDILOL 3.125 MG PO TABS: 3.1250 mg | ORAL_TABLET | Freq: Two times a day (BID) | ORAL | Status: DC

## 2013-07-26 MED ORDER — POTASSIUM CHLORIDE CRYS ER 20 MEQ PO TBCR
40.0000 meq | EXTENDED_RELEASE_TABLET | Freq: Once | ORAL | Status: AC
  Administered 2013-07-26: 40 meq via ORAL
  Filled 2013-07-26 (×2): qty 2

## 2013-07-26 MED ORDER — DIGOXIN 250 MCG PO TABS
0.2500 mg | ORAL_TABLET | Freq: Every day | ORAL | Status: DC
  Administered 2013-07-26: 0.25 mg via ORAL
  Filled 2013-07-26: qty 1

## 2013-07-26 MED ORDER — ATORVASTATIN CALCIUM 40 MG PO TABS: 40.0000 mg | ORAL_TABLET | Freq: Every day | ORAL | Status: DC

## 2013-07-26 MED ORDER — FUROSEMIDE 40 MG PO TABS: 40.0000 mg | ORAL_TABLET | Freq: Every day | ORAL | Status: DC

## 2013-07-26 MED ORDER — SPIRONOLACTONE 12.5 MG HALF TABLET: 12.5000 mg | ORAL_TABLET | Freq: Every day | ORAL | Status: DC

## 2013-07-26 MED ORDER — LISINOPRIL 10 MG PO TABS: 10.0000 mg | ORAL_TABLET | Freq: Every day | ORAL | Status: DC

## 2013-07-26 MED ORDER — ASPIRIN 81 MG PO TBEC: 81.0000 mg | DELAYED_RELEASE_TABLET | Freq: Every day | ORAL | Status: DC

## 2013-07-26 MED ORDER — FUROSEMIDE 40 MG PO TABS
40.0000 mg | ORAL_TABLET | Freq: Every day | ORAL | Status: DC
  Administered 2013-07-26: 40 mg via ORAL
  Filled 2013-07-26: qty 1

## 2013-07-26 MED ORDER — FUROSEMIDE 40 MG PO TABS
40.0000 mg | ORAL_TABLET | Freq: Two times a day (BID) | ORAL | Status: DC
  Filled 2013-07-26 (×3): qty 1

## 2013-07-26 MED ORDER — DIGOXIN 250 MCG PO TABS: 0.2500 mg | ORAL_TABLET | Freq: Every day | ORAL | Status: DC

## 2013-07-26 NOTE — Progress Notes (Signed)
Co-signed for LeTitia Teasley RN/BSN for assessments, IV assessments, medication administration, progress notes, patient education, I's and O's, and vital signs. Rachael Ferrie M, RN/BSN 

## 2013-07-26 NOTE — Progress Notes (Signed)
Pt with no urine in urinal this AM. Pt states he has been voiding in toilet. Pt educated on importance of using urinal to measure urine. Pt encouraged to call RN if urinal needs to be emptied. Pt weight down 1.7 kg since yesterday. Baron Hamper, RN

## 2013-07-26 NOTE — Discharge Summary (Signed)
Physician Discharge Summary  Stephen Kane ZOX:[id number] DOB: 12-Jun-1971 DOA: 07/23/2013  PCP: No PCP Per Patient  Admit date: 07/23/2013 Discharge date: 07/26/2013  Time spent: 35 minutes  Recommendations for Outpatient Follow-up:  1. Follow up with HF clinic in 1 week   Discharge Diagnoses:  Principal Problem:   Flank pain Active Problems:   Acute on chronic systolic congestive heart failure   Respiratory distress   Hypertensive heart disease   Congestive dilated cardiomyopathy   Morbid obesity   Acute systolic heart failure  Discharge Condition: stable  Diet recommendation: heart healthy, low sodium  Filed Weights   07/24/13 1544 07/25/13 0502 07/26/13 0455  Weight: 137.8 kg (303 lb 12.7 oz) 135.9 kg (299 lb 9.7 oz) 134.2 kg (295 lb 13.7 oz)   History of present illness:  Stephen Kane is a 42 y.o. male with a history of HTN who presents to the ED with complaints of worsening SOB over the past week. He came to the ED tonight due to the SOB and pain in his right side. He denies having chest pain. He does report having orthopnea and DOE, and he had not noticed that he had lower extremity edema until it was pointed out to him by the EDP. When he arrived in the ED, he was hypoxic and was placed on BIPAP and on his evaluation his BNP returned elevated at 3783.0, and a Chest X-Ray and CTA of the Chest revealed Cardiomegaly, and diffuse Interstitial Bronchovascular Edema. He was administered IV lasix x 1 dose and referred for medical admission.   Hospital Course:  Acute systolic/Diastolic CHF- New diagnosis, likely due to NICM per cath, cardiology has been consulted while patient hospitalized. He responded well to iv diuresis and transitioned to po on discharge. He is to go home on Lasix 40 mg daily, spironolactone 12.5 daily, Digoxin 0.25 mg daily, Lisinopril 10 mg daily, Lipitor 40 mg daily and Aspirin 81. He is to follow up in HF clinic in 1 week. Patient clinically improved with  diuresis and able to ambulate without chest pain or shortness of breath.  HTN Obesity Lumbar disk disease  Procedures:  Cardiac cath 07/24/13 Assessment:  1. Moderate non-obstructive CAD  2. Severe LV dysfunction due to nonischemic CM  3. Moderate pulmonary venous HTN  4. Markedly elevated left sided pressures with normal cardiac output   2D echo Study Conclusions - Left ventricle: The cavity size was moderately to severely dilated. Wall thickness was increased in a pattern of mild LVH. The estimated ejection fraction was 20%. Diffuse hypokinesis. Findings consistent with left ventricular diastolic dysfunction. - Aortic valve: Sclerosis without stenosis. No significant regurgitation. - Left atrium: The atrium was moderately to severely dilated. - Right ventricle: The cavity size was mildly dilated. Systolic function was mildly reduced.   Consultations:  Cardiology, heart failure  Discharge Exam: Filed Vitals:   07/26/13 0455 07/26/13 0823 07/26/13 1108 07/26/13 1117  BP: 110/68 113/68 118/79   Pulse: 83 81  95  Temp: 97.9 F (36.6 C)     TempSrc: Axillary     Resp: 20     Height:      Weight: 134.2 kg (295 lb 13.7 oz)     SpO2: 94%      General: NAD Cardiovascular: RRR Respiratory: CTA biL  Discharge Instructions  Discharge Orders   Future Appointments Provider Department Dept Phone   08/03/2013 3:40 PM [facility] [phone]   Future Orders Complete By  Expires   Amb Referral to Cardiac Rehabilitation  As directed        Medication List         albuterol 108 (90 BASE) MCG/ACT inhaler  Commonly known as:  PROVENTIL HFA;VENTOLIN HFA  Inhale 2 puffs into the lungs every 6 (six) hours as needed for wheezing (wheezing).     aspirin 81 MG EC tablet  Take 1 tablet (81 mg total) by mouth daily.     atorvastatin 40 MG tablet  Commonly known as:  LIPITOR  Take 1 tablet (40 mg total) by mouth daily at 6 PM.      carvedilol 3.125 MG tablet  Commonly known as:  COREG  Take 1 tablet (3.125 mg total) by mouth 2 (two) times daily with a meal.     digoxin 0.25 MG tablet  Commonly known as:  LANOXIN  Take 1 tablet (0.25 mg total) by mouth daily.     furosemide 40 MG tablet  Commonly known as:  LASIX  Take 1 tablet (40 mg total) by mouth daily.     lisinopril 10 MG tablet  Commonly known as:  PRINIVIL,ZESTRIL  Take 1 tablet (10 mg total) by mouth daily.     spironolactone 12.5 mg Tabs tablet  Commonly known as:  ALDACTONE  Take 0.5 tablets (12.5 mg total) by mouth daily.           Follow-up Information   Follow up with Arvilla Meres, MD On 08/03/2013. (at 3:40 Garage Code 0200 )    Specialty:  Cardiology   Contact information:   [address] [place] [address] [phone]       The results of significant diagnostics from this hospitalization (including imaging, microbiology, ancillary and laboratory) are listed below for reference.    Significant Diagnostic Studies: Dg Chest 2 View  07/22/2013   CLINICAL DATA:  Shortness of breath for 1 hr since feeling a pop and a hot sensation at the right side of is lower chest, history COPD, former smoker, hypertension  EXAM: CHEST  2 VIEW  COMPARISON:  03/26/2013  FINDINGS: Enlargement of cardiac silhouette with pulmonary vascular congestion.  Question medial right basilar infiltrate, markings slightly increased versus previous study.  Mild chronic bronchitic changes.  Remaining lungs clear.  No pleural effusion or pneumothorax.  Bones demineralized.  IMPRESSION: Cancer congestion  Bronchitic changes with question medial right basilar infiltrate.   Electronically Signed   By: Ulyses Southward M.D.   On: 07/22/2013 23:51   Ct Angio Chest Pe W/cm &/or Wo Cm  07/23/2013   CLINICAL DATA:  Shortness of breath.  EXAM: CT ANGIOGRAPHY CHEST WITH CONTRAST  TECHNIQUE: Multidetector CT imaging of the chest was performed using the  standard protocol during bolus administration of intravenous contrast. Multiplanar CT image reconstructions including MIPs were obtained to evaluate the vascular anatomy.  CONTRAST:  OMNIPAQUE IOHEXOL 350 MG/ML SOLN  COMPARISON:  03/26/2013  FINDINGS: THORACIC INLET/BODY WALL:  No acute abnormality.  MEDIASTINUM:  Cardiomegaly, with the left ventricle particularly dilated. Extensive coronary artery atherosclerosis, age advanced. No pericardial effusion. No pulmonary embolism seen. Sensitivity decreased beyond the segmental pulmonary artery at multiple levels due to respiratory motion. Mild lymphadenopathy, with interval increase likely congestive.  LUNG WINDOWS:  Diffuse interlobular septal thickening with patchy ground-glass opacities small, layering bilateral pleural effusions. There is a diffuse bronchial wall thickening which is likely interstitial/bronchovascular edema. No asymmetric opacity to suggest superimposed consolidative pneumonia.  UPPER ABDOMEN:  No acute  findings.  OSSEOUS:  No acute fracture.  No suspicious lytic or blastic lesions.  Review of the MIP images confirms the above findings.  IMPRESSION: 1. CHF. 2. Cardiomegaly and extensive, age advanced coronary artery atherosclerosis. 3. Negative for pulmonary embolism to the level of the segmental arteries.   Electronically Signed   By: Tiburcio Pea M.D.   On: 07/23/2013 03:18    Microbiology: Recent Results (from the past 240 hour(s))  MRSA PCR SCREENING     Status: None   Collection Time    07/23/13  6:23 AM      Result Value Range Status   MRSA by PCR NEGATIVE  NEGATIVE Final   Comment:            The GeneXpert MRSA Assay (FDA     approved for NASAL specimens     only), is one component of a     comprehensive MRSA colonization     surveillance program. It is not     intended to diagnose MRSA     infection nor to guide or     monitor treatment for     MRSA infections.     Labs: Basic Metabolic Panel:  Recent  Labs Lab 07/23/13 0101 07/23/13 1500 07/24/13 0415 07/24/13 0845 07/25/13 0529 07/26/13 0435  NA 137 136 138  --  138 138  K 4.1 4.1 4.3  --  3.9 3.4*  CL 101 98 99  --  97 95*  CO2 24 27 28   --  30 30  GLUCOSE 140* 125* 97  --  90 81  BUN 14 13 13   --  16 17  CREATININE 0.83 0.77 0.77 0.84 0.77 0.76  CALCIUM 9.6 9.3 9.3  --  9.3 9.5  MG  --   --  2.1  --   --   --    Liver Function Tests:  Recent Labs Lab 07/23/13 1500  AST 23  ALT 27  ALKPHOS 65  BILITOT 0.7  PROT 7.2  ALBUMIN 3.9   CBC:  Recent Labs Lab 07/23/13 0101 07/24/13 0415 07/24/13 0845 07/25/13 0529  WBC 11.2* 11.5* 12.0* 10.1  NEUTROABS 8.3*  --   --   --   HGB 15.8 15.3 15.1 15.6  HCT 47.3 47.6 46.8 46.9  MCV 86.0 87.2 88.5 86.7  PLT 366 395 369 375   Cardiac Enzymes:  Recent Labs Lab 07/23/13 1300 07/23/13 1838 07/23/13 2355  TROPONINI <0.30 <0.30 <0.30   BNP: BNP (last 3 results)  Recent Labs  07/23/13 0215  PROBNP 3783.0*    Signed:  Pamella Pert  [facility] 07/26/2013, 5:36 PM

## 2013-07-26 NOTE — Progress Notes (Signed)
DC IV, Dc Tele, DC Home. Discharge instructions and home medications discussed with patient and patient's family members. Patient and family denied any questions or concerns at this time. Patient leaving unit via wheelchair and appears in no acute distress.

## 2013-07-26 NOTE — Progress Notes (Signed)
Patient is emptying urinal before charting can be completed. Patient has been told that it is important for [place] to empty it. Patient is having urine output.

## 2013-07-26 NOTE — Progress Notes (Addendum)
Advanced Heart Failure Team Consult Note  Referring Physician: Dr Donnie Aho  Primary Physician: Primary Cardiologist:  None   Reason for Consultation: New Diagnosis Heart Failure   HPI:    Stephen Kane is a 42  year old male with history of HTN, obesity, former smoker quit 2010- 1ppd,  OSA CPAP and a lumbar surgery for ruptured disk with no known coronary disease referred to the HF team by Dr Donnie Aho due to new diagnosis of acute systolic heart failure with severely reduced EF 20%.  In 2010 he says he lost 100 pounds with hcg but has since gained it all back.   Evaluated [facility] ED 03/2013 with . + Increased dyspnea. CT of chest negative for PE. Oxygen dropped to 80s. He was given breathing treatments ans later discharged that day with recommendations for pulmonary follow up. He did not follow up.   Uses CPAP nightly at home. He does not have PCP.SOB walking up steps.  A week ago he says he had pain in his R side.   Does not exercise. Works full time in IT  He presented to [facility] ED 07/23/13 with R upper back and side pain. + progressive dyspnea on exertion. Hypoxic on arrival and placed on short term BiPap. Admit Pro BNP 3783, CEs negative, K 4.1 , TSH 2.2, 11.2 , Hemoglobin 15.3. He placed intermittent IV lasix 40 mg every 12 hours.  ECHO revealed EF 20%,moderately to severely dilated LV and mildly dilated RV.  CT of chest negative for PE but with advanced atheroscerosis. Cardiology was consulted and he was started on carvedilol ECHO revealed EF 20%,moderately to severely dilated LV and mildly dilated RV.     Yesterday he continued on IV lasix. Also digoxin and spironolactone added.  Weight down 4 pounds. 24 hour I/O -820. Denies SOB/PND. Uses CPAP last night.   RHC/LHF 07/24/13  RA: 12  RV: 56/18/19  PA: 54/20 (40)  PCWP: 35  Fick CO/CI: 6.6/2.6  PVR: 0.9 Woods  Ao Sat: 89%  PA sats: 61% 64%  Left main: Calcified 20% stenosis  LAD: Gives off 2 diagonals. 30% proximal lesion distal vessel tapers with  moderate diffuse plaque. No high grade lesions.  LCX: Nondominant. 30% prox to mid lesion. Small OM-1. Large OM-2. 40% prox OM-2  RCA: Dominant vessel. 40% tubular lesion in midsection. Large RV branch with 50% ostial lesion. . Large PDA.  LV-gram done in the RAO projection: Markedly dilated. Severe global HK Ejection fraction = 20-25%    SH: Lives with Mom, Dad and daughter. Quit smoking 1 month ago   FH: Mom MI x 2 and Heart Failure DM         Father prostate cancer DM    Home Medications Prior to Admission medications   Medication Sig Start Date End Date Taking? Authorizing Provider  albuterol (PROVENTIL HFA;VENTOLIN HFA) 108 (90 BASE) MCG/ACT inhaler Inhale 2 puffs into the lungs every 6 (six) hours as needed for wheezing (wheezing).   Yes Historical Provider, MD    Past Medical History: Past Medical History  Diagnosis Date  . Hypertension   . Lumbar disc disease   . Morbidly obese   . Cardiomyopathy     Past Surgical History: Past Surgical History  Procedure Laterality Date  . Lumbar laminectomy      Family History: Family History  Problem Relation Age of Onset  . Diabetes Father   . Diabetes Mother   . Obesity Sister     Social History: History  Social History  . Marital Status: Single    Spouse Name: N/A    Number of Children: N/A  . Years of Education: N/A   Social History Main Topics  . Smoking status: Former Smoker -- 1.00 packs/day    Types: Cigarettes    Quit date: 06/21/2013  . Smokeless tobacco: None     Comment: uses ecig  . Alcohol Use: Yes  . Drug Use: No  . Sexual Activity: None   Other Topics Concern  . None   Social History Narrative  . None    Allergies:  No Known Allergies  Objective:    Vital Signs:   Temp:  [97.6 F (36.4 C)-98.1 F (36.7 C)] 97.9 F (36.6 C) (11/05 0455) Pulse Rate:  [81-96] 81 (11/05 0823) Resp:  [18-20] 20 (11/05 0455) BP: (98-114)/(63-82) 113/68 mmHg (11/05 0823) SpO2:  [92 %-94 %] 94 %  (11/05 0455) Weight:  [295 lb 13.7 oz (134.2 kg)] 295 lb 13.7 oz (134.2 kg) (11/05 0455) Last BM Date: 07/22/13  Weight change: Filed Weights   07/24/13 1544 07/25/13 0502 07/26/13 0455  Weight: 303 lb 12.7 oz (137.8 kg) 299 lb 9.7 oz (135.9 kg) 295 lb 13.7 oz (134.2 kg)    Intake/Output:   Intake/Output Summary (Last 24 hours) at 07/26/13 0905 Last data filed at 07/26/13 0100  Gross per 24 hour  Intake    480 ml  Output    900 ml  Net   -420 ml     Physical Exam: General:  Fatigued  appearing. No resp difficulty Sitting in chair.  HEENT: normal Neck: supple. JVP difficult to assess due to body habitus Carotids 2+ bilat; no bruits. No lymphadenopathy or thryomegaly appreciated. Cor: PMI nondisplaced. Regular rate & rhythm. No rubs,  or murmurs. +S3 Lungs: clear Abdomen: Obese, soft, nontender, nondistended. No hepatosplenomegaly. No bruits or masses. Good bowel sounds. Extremities: no cyanosis, clubbing, rash, no  R and LLE edema Neuro: alert & orientedx3, cranial nerves grossly intact. moves all 4 extremities w/o difficulty. Affect pleasant  Telemetry: SR 90s   Labs: Basic Metabolic Panel:  Recent Labs Lab 07/23/13 0101 07/23/13 1500 07/24/13 0415 07/24/13 0845 07/25/13 0529 07/26/13 0435  NA 137 136 138  --  138 138  K 4.1 4.1 4.3  --  3.9 3.4*  CL 101 98 99  --  97 95*  CO2 24 27 28   --  30 30  GLUCOSE 140* 125* 97  --  90 81  BUN 14 13 13   --  16 17  CREATININE 0.83 0.77 0.77 0.84 0.77 0.76  CALCIUM 9.6 9.3 9.3  --  9.3 9.5  MG  --   --  2.1  --   --   --     Liver Function Tests:  Recent Labs Lab 07/23/13 1500  AST 23  ALT 27  ALKPHOS 65  BILITOT 0.7  PROT 7.2  ALBUMIN 3.9   No results found for this basename: LIPASE, AMYLASE,  in the last 168 hours No results found for this basename: AMMONIA,  in the last 168 hours  CBC:  Recent Labs Lab 07/23/13 0101 07/24/13 0415 07/24/13 0845 07/25/13 0529  WBC 11.2* 11.5* 12.0* 10.1  NEUTROABS  8.3*  --   --   --   HGB 15.8 15.3 15.1 15.6  HCT 47.3 47.6 46.8 46.9  MCV 86.0 87.2 88.5 86.7  PLT 366 395 369 375    Cardiac Enzymes:  Recent Labs Lab 07/23/13 1300 07/23/13  1838 07/23/13 2355  TROPONINI <0.30 <0.30 <0.30    BNP: BNP (last 3 results)  Recent Labs  07/23/13 0215  PROBNP 3783.0*    CBG: No results found for this basename: GLUCAP,  in the last 168 hours  Coagulation Studies: No results found for this basename: LABPROT, INR,  in the last 72 hours  Other results:  Imaging: No results found.   Medications:     Current Medications: . aspirin EC  81 mg Oral Daily  . atorvastatin  40 mg Oral q1800  . carvedilol  3.125 mg Oral BID WC  . digoxin  0.125 mg Oral Daily  . enoxaparin (LOVENOX) injection  40 mg Subcutaneous Q24H  . lisinopril  10 mg Oral Daily  . morphine  4 mg Intravenous Once  . sodium chloride  3 mL Intravenous Q12H  . spironolactone  12.5 mg Oral Daily    Infusions:     Assessment:   1. Acute Systolic Heart Failure- 07/23/13 ECHO EF 20%  2. HTN 3. Obesity 4. OSA- CPAP 5. Former Smoker - Quit 1 month ago 6. R rib pain   Plan/Discussion:    Stephen Kane is 38 year old with newly diagnosed acute systolic heart failure EF 20%. Discussed possible etiologies of ischemic vs. NICM. NYHA III  Volume improved. Stop IV lasix and switch to lasix 40 mg po daily and continue  12.5 mg spironolactone daily. Overall weight down 15 pounds. Continue carvedilol 3.125 mg twice a day and lisinopril 10 mg daily.  Renal function stable. Reinforced daily weights, low salt food choices, limiting fluid intake to < 2 liter per day, and medication compliance.   Follow up next week in HF clinic 08/03/13 at 3:40. OK to discharge home  SPEP and UPEP pending.   Continue  lipitor 40 mg daily and 81 mg aspirin daily. Will need LFTs and lipid panel in 3 months.   Cardiac rehab following.O2 sats on room air. Ok.  Dietitian input appreciated.   Discharge  medications:  Lasix 40 mg daily Spironolactone 12.5 mg daily Carvedilol 3.125 mg twice a day Digoxin 0.25 mg daily Lisinopril 10 mg daily Lipitor 40 mg daily Aspirin 81 mg daily  Refer to pulmonary for CPAP.   Length of Stay: 3 CLEGG,AMY NP-C 07/26/2013, 9:05 AM Advanced Heart Failure Team Pager [phone] (M-F; 7a - 4p)  Please contact [facility] for night-coverage after hours (4p -7a ) and weekends on [email or web address]  Patient seen and examined with Tonye Becket, NP. We discussed all aspects of the encounter. I agree with the assessment and plan as stated above. He looks much better. Can go home today on the meds listed above. Will need f/u in HF clinic next week. Will also need outpatient sleep study. Supp K+ prior to d/c. Will give 40 extra today.  Truman Hayward 9:33 AM

## 2013-08-03 ENCOUNTER — Encounter (HOSPITAL_COMMUNITY): Payer: Self-pay

## 2013-08-03 ENCOUNTER — Encounter: Payer: Self-pay | Admitting: Internal Medicine

## 2013-08-03 ENCOUNTER — Ambulatory Visit (HOSPITAL_COMMUNITY)
Admit: 2013-08-03 | Discharge: 2013-08-03 | Disposition: A | Discharge status: Home / Self Care | Payer: Commercial Managed Care - PPO | Source: Ambulatory Visit | Attending: Internal Medicine | Admitting: Internal Medicine

## 2013-08-03 VITALS — BP 138/86 | HR 98 | Ht 70.0 in | Wt 306.0 lb

## 2013-08-03 DIAGNOSIS — G4733 Obstructive sleep apnea (adult) (pediatric): Secondary | ICD-10-CM | POA: Insufficient documentation

## 2013-08-03 DIAGNOSIS — E669 Obesity, unspecified: Secondary | ICD-10-CM | POA: Insufficient documentation

## 2013-08-03 DIAGNOSIS — I509 Heart failure, unspecified: Secondary | ICD-10-CM | POA: Insufficient documentation

## 2013-08-03 DIAGNOSIS — I5022 Chronic systolic (congestive) heart failure: Secondary | ICD-10-CM

## 2013-08-03 DIAGNOSIS — I2510 Atherosclerotic heart disease of native coronary artery without angina pectoris: Secondary | ICD-10-CM | POA: Insufficient documentation

## 2013-08-03 DIAGNOSIS — I428 Other cardiomyopathies: Secondary | ICD-10-CM | POA: Insufficient documentation

## 2013-08-03 DIAGNOSIS — I10 Essential (primary) hypertension: Secondary | ICD-10-CM | POA: Insufficient documentation

## 2013-08-03 DIAGNOSIS — Z87891 Personal history of nicotine dependence: Secondary | ICD-10-CM | POA: Insufficient documentation

## 2013-08-03 MED ORDER — SPIRONOLACTONE 25 MG PO TABS: 12.5000 mg | ORAL_TABLET | Freq: Every day | ORAL | Status: DC

## 2013-08-03 MED ORDER — CARVEDILOL 6.25 MG PO TABS: 6.2500 mg | ORAL_TABLET | Freq: Two times a day (BID) | ORAL | Status: DC

## 2013-08-03 NOTE — Progress Notes (Signed)
Patient ID: Stephen Kane, male   DOB: [birth year], 42 y.o.   MRN: [id number]  Weight Range    Baseline proBNP   GUIDE IT PATIENT PCP: none HPI: Stephen Kane is a 42 year old male with history of HTN, obesity, former smoker quit 2010- 1ppd, OSA CPAP and a lumbar surgery for ruptured disk with no known coronary disease.   Admitted to [facility] in November 2014 with increased dyspnea. ECHO obtained revealed EF 20%. Diuresed with IV lasix and diuresed with po lasix. Started on HF meds. Discharge weight 295 pounds.   RHC/LHF 07/24/13  RA: 12  RV: 56/18/19  PA: 54/20 (40)  PCWP: 35  Fick CO/CI: 6.6/2.6  PVR: 0.9 Woods  Ao Sat: 89%  PA sats: 61% 64%  Left main: Calcified 20% stenosis  LAD: Gives off 2 diagonals. 30% proximal lesion distal vessel tapers with moderate diffuse plaque. No high grade lesions.  LCX: Nondominant. 30% prox to mid lesion. Small OM-1. Large OM-2. 40% prox OM-2  RCA: Dominant vessel. 40% tubular lesion in midsection. Large RV branch with 50% ostial lesion. . Large PDA.  LV-gram done in the RAO projection: Markedly dilated. Severe global HK Ejection fraction = 20-25%    Labs 07/26/13 K 3.4 Creatinine 0.76 SPEP No M spike Labs 07/24/13 Cholesterol 200 TG 132 HDL 42 LDL 132  Labs 07/23/13 TSH 2.2  Pro BNP 3783 Hemoglobin A1C 6  He returns for post hospital follow up. Overall he feeling better. Denies SOB/PND/Orthopnea. Mild dyspnea going up steps. Weight at home 297-300 pounds.Not exercising. Taking all medications. Working full time at [employer].   ROS: All systems negative except as listed in HPI, PMH and Problem List.  Past Medical History  Diagnosis Date  . Hypertension   . Lumbar disc disease   . Morbidly obese   . Cardiomyopathy     Current Outpatient Prescriptions  Medication Sig Dispense Refill  . aspirin EC 81 MG EC tablet Take 1 tablet (81 mg total) by mouth daily.  30 tablet  1  . atorvastatin (LIPITOR) 40 MG tablet Take 1 tablet (40 mg total) by mouth  daily at 6 PM.  30 tablet  1  . carvedilol (COREG) 3.125 MG tablet Take 1 tablet (3.125 mg total) by mouth 2 (two) times daily with a meal.  60 tablet  1  . digoxin (LANOXIN) 0.25 MG tablet Take 1 tablet (0.25 mg total) by mouth daily.  30 tablet  1  . furosemide (LASIX) 40 MG tablet Take 1 tablet (40 mg total) by mouth daily.  30 tablet  1  . lisinopril (PRINIVIL,ZESTRIL) 10 MG tablet Take 1 tablet (10 mg total) by mouth daily.  30 tablet  1  . spironolactone (ALDACTONE) 12.5 mg TABS tablet Take 0.5 tablets (12.5 mg total) by mouth daily.  30 tablet  1   No current facility-administered medications for this encounter.     PHYSICAL EXAM: Filed Vitals:   08/03/13 1539  BP: 138/86  Pulse: 98  Height: 5\' 10"  (1.778 m)  Weight: 306 lb (138.801 kg)  SpO2: 97%    General:  Well appearing. No resp difficulty HEENT: normal Neck: supple. JVP difficult to assess due to body habitus.  Carotids 2+ bilaterally; no bruits. No lymphadenopathy or thryomegaly appreciated. Cor: PMI normal. Regular rate & rhythm. No rubs, gallops or murmurs. Lungs: clear Abdomen: Obese, soft, nontender, nondistended. No hepatosplenomegaly. No bruits or masses. Good bowel sounds. Extremities: no cyanosis, clubbing, rash, edema Neuro: alert & orientedx3, cranial nerves  grossly intact. Moves all 4 extremities w/o difficulty. Affect pleasant.    ASSESSMENT & PLAN: 1. Chronic Systolic heart Failure. NICM 07/2013 EF 20% NYHA II mild dyspnea with steps.  -Volume status stable. Continue lasix 40 mg daily and 12.5 mg spironolactone daily -Increase carvedilol to 6.25 mg twice a day and digoxin 0.25 mg daily - Continue lisinopril 10 mg daily - Reinforced daily weights, low salt food choices, and limiting fluid intake to < 2 liters per day.  -Check BMET and Pro BNP today.  -Enrolled in [employer] study.  Plan to repeat ECHO in 3 months after HF meds optimized. (like February)  2. OSA- Uses CPAP nightly  3. Obesity  Encourage to exercise and portion control. He plans to attend the HF Diet class.  4. CAD- Left main: Calcified 20% stenosis LAD: Gives off 2 diagonals. 30% proximal lesion distal LCX: Nondominant. 30% prox to mid lesion. Small OM-1. Large OM-2. 40% prox OM-2 RCA: Dominant vessel. 40% tubular lesion in midsection. Large RV branch with 50% ostial lesion Continue baby aspirin and atorvastatin.     Follow up in 2 weeks.   Jazalynn Mireles 5:16 PM

## 2013-08-03 NOTE — Patient Instructions (Signed)
Follow up in 3-4 weeks  Take carvedilol 6.25 mg twice a day  Take an extra lasix 40 mg if your weight is 304 pounds or greater  Do the following things EVERYDAY: 1) Weigh yourself in the morning before breakfast. Write it down and keep it in a log. 2) Take your medicines as prescribed 3) Eat low salt foods-Limit salt (sodium) to 2000 mg per day.  4) Stay as active as you can everyday 5) Limit all fluids for the day to less than 2 liters

## 2013-08-15 ENCOUNTER — Encounter: Payer: Self-pay | Admitting: Internal Medicine

## 2013-08-15 ENCOUNTER — Ambulatory Visit (HOSPITAL_COMMUNITY)
Admission: RE | Admit: 2013-08-15 | Discharge: 2013-08-15 | Disposition: A | Discharge status: Home / Self Care | Payer: Commercial Managed Care - PPO | Source: Ambulatory Visit | Attending: Internal Medicine | Admitting: Internal Medicine

## 2013-08-15 VITALS — BP 136/84 | HR 91 | Wt 311.0 lb

## 2013-08-15 DIAGNOSIS — Z79899 Other long term (current) drug therapy: Secondary | ICD-10-CM | POA: Insufficient documentation

## 2013-08-15 DIAGNOSIS — I428 Other cardiomyopathies: Secondary | ICD-10-CM | POA: Insufficient documentation

## 2013-08-15 DIAGNOSIS — I10 Essential (primary) hypertension: Secondary | ICD-10-CM | POA: Insufficient documentation

## 2013-08-15 DIAGNOSIS — I509 Heart failure, unspecified: Secondary | ICD-10-CM | POA: Insufficient documentation

## 2013-08-15 DIAGNOSIS — G4733 Obstructive sleep apnea (adult) (pediatric): Secondary | ICD-10-CM

## 2013-08-15 DIAGNOSIS — I5022 Chronic systolic (congestive) heart failure: Secondary | ICD-10-CM

## 2013-08-15 DIAGNOSIS — I2510 Atherosclerotic heart disease of native coronary artery without angina pectoris: Secondary | ICD-10-CM | POA: Insufficient documentation

## 2013-08-15 DIAGNOSIS — E669 Obesity, unspecified: Secondary | ICD-10-CM | POA: Insufficient documentation

## 2013-08-15 MED ORDER — FUROSEMIDE 40 MG PO TABS: 40.0000 mg | ORAL_TABLET | Freq: Every day | ORAL | Status: DC

## 2013-08-15 MED ORDER — ASPIRIN 81 MG PO TBEC: 81.0000 mg | DELAYED_RELEASE_TABLET | Freq: Every day | ORAL | Status: DC

## 2013-08-15 MED ORDER — ATORVASTATIN CALCIUM 40 MG PO TABS: 40.0000 mg | ORAL_TABLET | Freq: Every day | ORAL | Status: DC

## 2013-08-15 MED ORDER — CARVEDILOL 6.25 MG PO TABS: 9.3750 mg | ORAL_TABLET | Freq: Two times a day (BID) | ORAL | Status: DC

## 2013-08-15 MED ORDER — LISINOPRIL 10 MG PO TABS: 10.0000 mg | ORAL_TABLET | Freq: Every day | ORAL | Status: DC

## 2013-08-15 MED ORDER — DIGOXIN 250 MCG PO TABS: 0.2500 mg | ORAL_TABLET | Freq: Every day | ORAL | Status: DC

## 2013-08-15 MED ORDER — SPIRONOLACTONE 25 MG PO TABS: 12.5000 mg | ORAL_TABLET | Freq: Every day | ORAL | Status: DC

## 2013-08-15 NOTE — Patient Instructions (Signed)
Follow up in 2-3 weeks  Take carvedilol 9.375 mg twice a day  Take an extra 40 mg of lasix if your weight is 306 pounds or greater.   Do the following things EVERYDAY: 1) Weigh yourself in the morning before breakfast. Write it down and keep it in a log. 2) Take your medicines as prescribed 3) Eat low salt foods-Limit salt (sodium) to 2000 mg per day.  4) Stay as active as you can everyday 5) Limit all fluids for the day to less than 2 liters

## 2013-08-15 NOTE — Progress Notes (Signed)
Patient ID: Stephen Kane, male   DOB: 1971-06-08, 43 y.o.   MRN: [id number]   Weight Range    Baseline proBNP   GUIDE IT PATIENT PCP: none HPI: Stephen Kane is a 42 year old male with history of HTN, obesity, former smoker quit 2010- 1ppd, OSA CPAP and a lumbar surgery for ruptured disk with no known coronary disease.   Admitted to [facility] in November 2014 with increased dyspnea. New diagnosis systolic heart failure. ECHO obtained revealed EF 20%. Diuresed with IV lasix and diuresed with po lasix. Started on HF meds. Discharge weight 295 pounds.   RHC/LHF 07/24/13  RA: 12  RV: 56/18/19  PA: 54/20 (40)  PCWP: 35  Fick CO/CI: 6.6/2.6  PVR: 0.9 Woods  Ao Sat: 89%  PA sats: 61% 64%  Left main: Calcified 20% stenosis  LAD: Gives off 2 diagonals. 30% proximal lesion distal vessel tapers with moderate diffuse plaque. No high grade lesions.  LCX: Nondominant. 30% prox to mid lesion. Small OM-1. Large OM-2. 40% prox OM-2  RCA: Dominant vessel. 40% tubular lesion in midsection. Large RV branch with 50% ostial lesion. . Large PDA.  LV-gram done in the RAO projection: Markedly dilated. Severe global HK Ejection fraction = 20-25%   Labs 11/09/23/12 K 4.5 Creatinine 0.85 Labs 07/26/13 K 3.4 Creatinine 0.76 SPEP No M spike Labs 07/24/13 Cholesterol 200 TG 132 HDL 42 LDL 132  Labs 07/23/13 TSH 2.2  Pro BNP 3783 Hemoglobin A1C 6   He returns for follow up. Last visit carvedilol was increased to 6.25 mg twice a day without difficulty.  Denies SOB/PND/Orthopnea/CP. Uses CPAP nightly. Denies SOB  going up steps. Weight at home  300-303 pounds. Walking 15 minutes 2 days aweek. Taking all medications. Following low salt diet and limits fluid intake to < 2 liters per day. Working full time at [employer].   ROS: All systems negative except as listed in HPI, PMH and Problem List.  Past Medical History  Diagnosis Date  . Hypertension   . Lumbar disc disease   . Morbidly obese   . Cardiomyopathy      Current Outpatient Prescriptions  Medication Sig Dispense Refill  . atorvastatin (LIPITOR) 40 MG tablet Take 1 tablet (40 mg total) by mouth daily at 6 PM.  30 tablet  1  . carvedilol (COREG) 6.25 MG tablet Take 1 tablet (6.25 mg total) by mouth 2 (two) times daily with a meal.  60 tablet  1  . digoxin (LANOXIN) 0.25 MG tablet Take 1 tablet (0.25 mg total) by mouth daily.  30 tablet  1  . furosemide (LASIX) 40 MG tablet Take 1 tablet (40 mg total) by mouth daily.  30 tablet  1  . lisinopril (PRINIVIL,ZESTRIL) 10 MG tablet Take 1 tablet (10 mg total) by mouth daily.  30 tablet  1  . spironolactone (ALDACTONE) 25 MG tablet Take 0.5 tablets (12.5 mg total) by mouth daily.  30 tablet  6  . aspirin EC 81 MG EC tablet Take 1 tablet (81 mg total) by mouth daily.  30 tablet  1   No current facility-administered medications for this encounter.     PHYSICAL EXAM: Filed Vitals:   08/15/13 1521  BP: 136/84  Pulse: 91  Weight: 311 lb (141.069 kg)  SpO2: 98%    General:  Well appearing. No resp difficulty HEENT: normal Neck: supple. JVP difficult to assess due to body habitus.  Carotids 2+ bilaterally; no bruits. No lymphadenopathy or thryomegaly appreciated. Cor: PMI normal.  Regular rate & rhythm. No rubs, gallops or murmurs. Lungs: clear Abdomen: Obese, soft, nontender, nondistended. No hepatosplenomegaly. No bruits or masses. Good bowel sounds. Extremities: no cyanosis, clubbing, rash, edema Neuro: alert & orientedx3, cranial nerves grossly intact. Moves all 4 extremities w/o difficulty. Affect pleasant.    ASSESSMENT & PLAN: 1. Chronic Systolic heart Failure. NICM 07/2013 EF 20% NYHA I-II  -Volume status stable. Continue lasix 40 mg daily and 12.5 mg spironolactone daily. Instructed to take extra 40 mg of lasix if his weight is 306 pounds.  -Increase carvedilol to 9.375  mg twice a day and continue digoxin 0.25 mg daily - Continue lisinopril 10 mg daily  - Reinforced daily weights,  low salt food choices, and limiting fluid intake to < 2 liters per day. Encouraged to exercise at least 15 minutes 4 days a week.  -Reviewed BMET from 08/03/13 . K 4.5 Creatinine 0.85. Check BMET today. Will need dig level at next visit.   -Enrolled in [employer] study.  Plan to repeat ECHO in 3 months after HF meds optimized. (February 2014) 2. OSA- Uses CPAP nightly  3. Obesity Encourage to exercise and portion control. Needs to attend the HF Diet class.  4. CAD- Left main: Calcified 20% stenosis LAD: Gives off 2 diagonals. 30% proximal lesion distal LCX: Nondominant. 30% prox to mid lesion. Small OM-1. Large OM-2. 40% prox OM-2 RCA: Dominant vessel. 40% tubular lesion in midsection. Large RV branch with 50% ostial lesion Continue baby aspirin and atorvastatin.. Check lipids in one month.    Follow up in 2 weeks will need dig level.   Stephen Kane,AMYNP-C 3:23 PM

## 2013-08-29 ENCOUNTER — Encounter (HOSPITAL_COMMUNITY): Payer: Commercial Managed Care - PPO

## 2013-09-07 ENCOUNTER — Ambulatory Visit (HOSPITAL_COMMUNITY)
Admission: RE | Admit: 2013-09-07 | Discharge: 2013-09-07 | Disposition: A | Discharge status: Home / Self Care | Payer: Commercial Managed Care - PPO | Source: Ambulatory Visit | Attending: Internal Medicine | Admitting: Internal Medicine

## 2013-09-07 ENCOUNTER — Encounter: Payer: Self-pay | Admitting: Internal Medicine

## 2013-09-07 VITALS — BP 152/86 | HR 101 | Wt 309.0 lb

## 2013-09-07 DIAGNOSIS — I5022 Chronic systolic (congestive) heart failure: Secondary | ICD-10-CM | POA: Insufficient documentation

## 2013-09-07 LAB — DIGOXIN LEVEL: Digoxin Level: 0.7 ng/mL — ABNORMAL LOW (ref 0.8–2.0)

## 2013-09-07 MED ORDER — CARVEDILOL 12.5 MG PO TABS: 12.5000 mg | ORAL_TABLET | Freq: Two times a day (BID) | ORAL | Status: DC

## 2013-09-07 NOTE — Progress Notes (Signed)
Patient ID: Stephen Kane, male   DOB: Jan 23, 1971, 42 y.o.   MRN: [id number]   Weight Range    Baseline proBNP   GUIDE IT PATIENT PCP: none HPI: Stephen Kane is a 42 year old male with history of chronic systolic heart failure due to NICM EF 20%, HTN, obesity, former smoker quit 2010, OSA on CPAP.  Admitted to [facility] in November 2014 with new-onset heart failure. ECHO EF 20%. Diuresed and started on HF meds. Discharge weight 295 pounds.   RHC/LHC 07/24/13  RA: 12  RV: 56/18/19  PA: 54/20 (40)  PCWP: 35  Fick CO/CI: 6.6/2.6  PVR: 0.9 Woods  Ao Sat: 89%  PA sats: 61% 64%  Left main: Calcified 20% stenosis  LAD: Gives off 2 diagonals. 30% proximal lesion distal vessel tapers with moderate diffuse plaque. No high grade lesions.  LCX: Nondominant. 30% prox to mid lesion. Small OM-1. Large OM-2. 40% prox OM-2  RCA: Dominant vessel. 40% tubular lesion in midsection. Large RV branch with 50% ostial lesion. . Large PDA.  LV-gram done in the RAO projection: Markedly dilated. Severe global HK Ejection fraction = 20-25%   Labs 11/09/23/12 K 4.5 Creatinine 0.85 Labs 07/26/13 K 3.4 Creatinine 0.76 SPEP No M spike Labs 07/24/13 Cholesterol 200 TG 132 HDL 42 LDL 132  Labs 07/23/13 TSH 2.2  Pro BNP 3783 Hemoglobin A1C 6 Labs 08/15/13 K 4.9 Creatinine 0.73  He returns for follow up. He is doing great. Last visit carvedilol was increased to 9.375 mg twice a day without difficulty.  Denies SOB/PND/Orthopnea/CP. Uses CPAP nightly.  Weight at home 303-305 pounds. He takes an extra lasix for high salt foods. Walking every other day 15-20 minutes.  Taking all medications. Following low salt diet and limits fluid intake to < 2 liters per day. Working full time at [employer].   ROS: All systems negative except as listed in HPI, PMH and Problem List.  Past Medical History  Diagnosis Date  . Hypertension   . Lumbar disc disease   . Morbidly obese   . Cardiomyopathy     Current Outpatient Prescriptions   Medication Sig Dispense Refill  . aspirin 81 MG EC tablet Take 1 tablet (81 mg total) by mouth daily.  30 tablet  10  . atorvastatin (LIPITOR) 40 MG tablet Take 1 tablet (40 mg total) by mouth daily at 6 PM.  30 tablet  10  . carvedilol (COREG) 6.25 MG tablet Take 1.5 tablets (9.375 mg total) by mouth 2 (two) times daily with a meal.  90 tablet  3  . digoxin (LANOXIN) 0.25 MG tablet Take 1 tablet (0.25 mg total) by mouth daily.  30 tablet  10  . furosemide (LASIX) 40 MG tablet Take 1 tablet (40 mg total) by mouth daily.  30 tablet  10  . lisinopril (PRINIVIL,ZESTRIL) 10 MG tablet Take 1 tablet (10 mg total) by mouth daily.  30 tablet  10  . spironolactone (ALDACTONE) 25 MG tablet Take 0.5 tablets (12.5 mg total) by mouth daily.  30 tablet  6   No current facility-administered medications for this encounter.     PHYSICAL EXAM: Filed Vitals:   09/07/13 1553  BP: 152/86  Pulse: 101  Weight: 309 lb (140.161 kg)  SpO2: 97%    General:  Well appearing. No resp difficulty HEENT: normal Neck: supple. JVP difficult to assess due to body habitus.  Carotids 2+ bilaterally; no bruits. No lymphadenopathy or thryomegaly appreciated. Cor: PMI normal. Regular rate & rhythm. No  rubs, gallops or murmurs. Lungs: clear Abdomen: Obese, soft, nontender, nondistended. No hepatosplenomegaly. No bruits or masses. Good bowel sounds. Extremities: no cyanosis, clubbing, rash, edema Neuro: alert & orientedx3, cranial nerves grossly intact. Moves all 4 extremities w/o difficulty. Affect pleasant.    ASSESSMENT & PLAN: -Enrolled in [employer] study. 1. Chronic Systolic heart Failure. NICM 07/2013 EF 20% NYHA I-II  -Volume status stable. Continue lasix 40 mg daily and 12.5 mg spironolactone daily. Continue to take extra 40 mg of lasix if his weight is 306 pounds or greater.  -Increase carvedilol to 12.5 mg twice a day. Continue digoxin 0.25 mg daily - Continue lisinopril 10 mg daily. Will not increase K from  11/25 was 4.9  - Reinforced daily weights, low salt food choices, and limiting fluid intake to < 2 liters per day.  Congratulated him on exercise efforts and encouraged to continue.  -Reviewed BMET 08/15/13  K 4.9 Creatinine 0.73.  Check BMET,  dig level, pro-bnp today    Plan to repeat ECHO in 3 months after HF meds optimized. (Early February 2014) 2. OSA- Continue CPAP nightly   3. Obesity-  Encourage to exercise and limit his portions.   4. CAD- Left main: Calcified 20% stenosis LAD: Gives off 2 diagonals. 30% proximal lesion distal LCX: Nondominant. 30% prox to mid lesion. Small OM-1. Large OM-2. 40% prox OM-2 RCA: Dominant vessel. 40% tubular lesion in midsection. Large RV branch with 50% ostial lesion No evidence of ischemia. Continue baby aspirin and atorvastatin..     Follow up in 2 weeks and continue to titrate medications.   CLEGG,AMY NP-C 3:56 PM  Patient seen and examined with Stephen Becket, NP. We discussed all aspects of the encounter. I agree with the assessment and plan as stated above.   Much improved. NYHA II. Volume status looks good. Continue to titrate meds above. Reviewed HF education.   Daniel Bensimhon,MD 10:30 PM

## 2013-09-07 NOTE — Patient Instructions (Signed)
Follow up in 2-3 weeks   Take carvedilol 12.5 twice a day   Do the following things EVERYDAY: 1) Weigh yourself in the morning before breakfast. Write it down and keep it in a log. 2) Take your medicines as prescribed 3) Eat low salt foods-Limit salt (sodium) to 2000 mg per day.  4) Stay as active as you can everyday 5) Limit all fluids for the day to less than 2 liters

## 2013-09-18 ENCOUNTER — Encounter: Payer: Self-pay | Admitting: Internal Medicine

## 2013-09-18 ENCOUNTER — Ambulatory Visit (HOSPITAL_COMMUNITY)
Admission: RE | Admit: 2013-09-18 | Discharge: 2013-09-18 | Disposition: A | Discharge status: Home / Self Care | Payer: Commercial Managed Care - PPO | Source: Ambulatory Visit | Attending: Internal Medicine | Admitting: Internal Medicine

## 2013-09-18 VITALS — BP 130/82 | HR 93 | Wt 311.0 lb

## 2013-09-18 DIAGNOSIS — I10 Essential (primary) hypertension: Secondary | ICD-10-CM | POA: Insufficient documentation

## 2013-09-18 DIAGNOSIS — I2510 Atherosclerotic heart disease of native coronary artery without angina pectoris: Secondary | ICD-10-CM | POA: Insufficient documentation

## 2013-09-18 DIAGNOSIS — I119 Hypertensive heart disease without heart failure: Secondary | ICD-10-CM

## 2013-09-18 DIAGNOSIS — I5022 Chronic systolic (congestive) heart failure: Secondary | ICD-10-CM | POA: Insufficient documentation

## 2013-09-18 DIAGNOSIS — G4733 Obstructive sleep apnea (adult) (pediatric): Secondary | ICD-10-CM

## 2013-09-18 MED ORDER — LISINOPRIL 10 MG PO TABS: 10.0000 mg | ORAL_TABLET | Freq: Two times a day (BID) | ORAL | Status: DC

## 2013-09-18 NOTE — Patient Instructions (Signed)
Increase lasix to 40 mg twice a day for 2 days, then go back to 40 mg daily.   Increase your lisinopril to 10 mg twice a day (1 tablet in the morning and 1 tablet in the evening)  Call any issues with medications.  Follow up 2 weeks.  Do the following things EVERYDAY: 1) Weigh yourself in the morning before breakfast. Write it down and keep it in a log. 2) Take your medicines as prescribed 3) Eat low salt foods-Limit salt (sodium) to 2000 mg per day.  4) Stay as active as you can everyday 5) Limit all fluids for the day to less than 2 liters 6)

## 2013-09-18 NOTE — Progress Notes (Addendum)
Patient ID: Stephen Kane, male   DOB: 04-02-1971, 42 y.o.   MRN: [id number]  Weight Range    Baseline proBNP   GUIDE IT PATIENT PCP: none  HPI: Stephen Kane is a 42 year old male with history of chronic systolic heart failure EF 20%, HTN, obesity, former smoker quit 2010- 1ppd, OSA CPAP and a lumbar surgery for ruptured disk with no known coronary disease.   Admitted to [facility] in November 2014 with increased dyspnea. New diagnosis systolic heart failure. ECHO obtained revealed EF 20%. Diuresed with IV lasix and diuresed with po lasix. Started on HF meds. Discharge weight 295 pounds.   RHC/LHF 07/24/13  RA: 12  RV: 56/18/19  PA: 54/20 (40)  PCWP: 35  Fick CO/CI: 6.6/2.6  PVR: 0.9 Woods  Ao Sat: 89%  PA sats: 61% 64%  Left main: Calcified 20% stenosis  LAD: Gives off 2 diagonals. 30% proximal lesion distal vessel tapers with moderate diffuse plaque. No high grade lesions.  LCX: Nondominant. 30% prox to mid lesion. Small OM-1. Large OM-2. 40% prox OM-2  RCA: Dominant vessel. 40% tubular lesion in midsection. Large RV branch with 50% ostial lesion. . Large PDA.  LV-gram done in the RAO projection: Markedly dilated. Severe global HK Ejection fraction = 20-25%   Labs 11/09/23/12 K 4.5 Creatinine 0.85 Labs 07/26/13 K 3.4 Creatinine 0.76 SPEP No M spike Labs 07/24/13 Cholesterol 200 TG 132 HDL 42 LDL 132  Labs 07/23/13 TSH 2.2  Pro BNP 3783 Hemoglobin A1C 6 Labs 08/15/13 K 4.9 Creatinine 0.73 Labs 09/07/13 K 4.7, Creatinine 0.93, Digoxin 0.7  Follow up: Last visit increased carvedilol to 12.5 mg BID. Doing well. Denies SOB, orthopnea, CP or edema. Able to go upstairs with no issues. Walked from [place] today with no issues. Using CPAP nightly. Weight at home 303-305 lbs. Following low salt diet and drinking less than 2L a day, ate a little too much over the holidays. Still working full time. Ran out of meds for 1-2 days over Christmas.   ROS: All systems negative except as listed in HPI, PMH and  Problem List.  Past Medical History  Diagnosis Date  . Hypertension   . Lumbar disc disease   . Morbidly obese   . Cardiomyopathy     Current Outpatient Prescriptions  Medication Sig Dispense Refill  . aspirin 81 MG EC tablet Take 1 tablet (81 mg total) by mouth daily.  30 tablet  10  . atorvastatin (LIPITOR) 40 MG tablet Take 1 tablet (40 mg total) by mouth daily at 6 PM.  30 tablet  10  . carvedilol (COREG) 12.5 MG tablet Take 1 tablet (12.5 mg total) by mouth 2 (two) times daily with a meal.  60 tablet  3  . digoxin (LANOXIN) 0.25 MG tablet Take 1 tablet (0.25 mg total) by mouth daily.  30 tablet  10  . furosemide (LASIX) 40 MG tablet Take 1 tablet (40 mg total) by mouth daily.  30 tablet  10  . lisinopril (PRINIVIL,ZESTRIL) 10 MG tablet Take 1 tablet (10 mg total) by mouth daily.  30 tablet  10  . spironolactone (ALDACTONE) 25 MG tablet Take 0.5 tablets (12.5 mg total) by mouth daily.  30 tablet  6   No current facility-administered medications for this encounter.     Filed Vitals:   09/18/13 0933  BP: 130/82  Pulse: 93  Weight: 311 lb (141.069 kg)  SpO2: 99%    PHYSICAL EXAM: General:  Well appearing. No resp difficulty  HEENT: normal Neck: supple. JVP difficult to assess due to body habitus, but appears mildly elevated.  Carotids 2+ bilaterally; no bruits. No lymphadenopathy or thryomegaly appreciated. Cor: PMI normal. Regular rate & rhythm. No rubs, gallops or murmurs. Lungs: clear Abdomen: Obese, soft, nontender, nondistended. No hepatosplenomegaly. No bruits or masses. Good bowel sounds. Extremities: no cyanosis, clubbing, rash, edema Neuro: alert & orientedx3, cranial nerves grossly intact. Moves all 4 extremities w/o difficulty. Affect pleasant.   ASSESSMENT & PLAN: -Enrolled in [employer] IT study.  1. Chronic Systolic heart Failure. NICM; EF 20% (07/2013)  - NYHA I symptoms and volume status slightly elevated. Will increase lasix to 40 mg BID for 2 days and then  back to 40 mg daily.  - Will continue coreg 12.5 mg BID. Increase lisinopril to 10 mg BID and check BMET 7-10 days. -  Reviewed labs from 09/07/13 and K 4.7, Creatinine 0.93, Digoxin 0.7. Check today with Guide-it - Reinforced the need and importance of daily weights, a low sodium diet, and fluid restriction (less than 2 L a day). Instructed to call the HF clinic if weight increases more than 3 lbs overnight or 5 lbs in a week.  - Plan to repeat ECHO in 3 months after HF meds optimized. (Early February 2014) 2. OSA- Continue CPAP nightly   3. Obesity-  Congratulated him on continued exercise and encouraged to try to increase and cut portion sizes.   4. CAD- Left main: Calcified 20% stenosis LAD: Gives off 2 diagonals. 30% proximal lesion distal LCX: Nondominant. 30% prox to mid lesion. Small OM-1. Large OM-2. 40% prox OM-2 RCA: Dominant vessel. 40% tubular lesion in midsection. Large RV branch with 50% ostial lesion - no s/s of ischemia. Continue ASA, statin, BB and ACE-I.  5. HTN - Controlled on Spiro, BB, ACE-I.  F/U 2 weeks for medication titration.   Ulla Potash B NP-C 10:00 AM

## 2013-10-04 ENCOUNTER — Encounter: Payer: Self-pay | Admitting: Internal Medicine

## 2013-10-04 ENCOUNTER — Encounter (HOSPITAL_COMMUNITY): Payer: Self-pay

## 2013-10-04 ENCOUNTER — Ambulatory Visit (HOSPITAL_COMMUNITY)
Admission: RE | Admit: 2013-10-04 | Discharge: 2013-10-04 | Disposition: A | Discharge status: Home / Self Care | Payer: Commercial Managed Care - PPO | Source: Ambulatory Visit | Attending: Internal Medicine | Admitting: Internal Medicine

## 2013-10-04 VITALS — BP 140/90 | HR 98 | Wt 313.0 lb

## 2013-10-04 DIAGNOSIS — Z79899 Other long term (current) drug therapy: Secondary | ICD-10-CM | POA: Insufficient documentation

## 2013-10-04 DIAGNOSIS — I428 Other cardiomyopathies: Secondary | ICD-10-CM | POA: Insufficient documentation

## 2013-10-04 DIAGNOSIS — I509 Heart failure, unspecified: Secondary | ICD-10-CM | POA: Insufficient documentation

## 2013-10-04 DIAGNOSIS — I2510 Atherosclerotic heart disease of native coronary artery without angina pectoris: Secondary | ICD-10-CM | POA: Insufficient documentation

## 2013-10-04 DIAGNOSIS — G4733 Obstructive sleep apnea (adult) (pediatric): Secondary | ICD-10-CM | POA: Insufficient documentation

## 2013-10-04 DIAGNOSIS — I5022 Chronic systolic (congestive) heart failure: Secondary | ICD-10-CM | POA: Insufficient documentation

## 2013-10-04 DIAGNOSIS — Z87891 Personal history of nicotine dependence: Secondary | ICD-10-CM | POA: Insufficient documentation

## 2013-10-04 DIAGNOSIS — E669 Obesity, unspecified: Secondary | ICD-10-CM | POA: Insufficient documentation

## 2013-10-04 DIAGNOSIS — Z7982 Long term (current) use of aspirin: Secondary | ICD-10-CM | POA: Insufficient documentation

## 2013-10-04 MED ORDER — LISINOPRIL 20 MG PO TABS: 20.0000 mg | ORAL_TABLET | Freq: Two times a day (BID) | ORAL | Status: DC

## 2013-10-04 NOTE — Patient Instructions (Signed)
Increase Furosemide (Lasix) 40 mg Twice a day for 2 days then as needed  Increase Lisinopril to 20 mg daily  Labs in 1 week  Your physician recommends that you schedule a follow-up appointment in: 3 weeks

## 2013-10-04 NOTE — Addendum Note (Signed)
Encounter addended by: Kerry Dory, CMA on: 10/04/2013  4:00 PM<BR>     Documentation filed: Patient Instructions Section, Orders

## 2013-10-04 NOTE — Progress Notes (Signed)
Patient ID: Stephen Kane, male   DOB: 1971/01/15, 43 y.o.   MRN: [id number]   Weight Range    Baseline proBNP   GUIDE IT PATIENT PCP: none HPI: Stephen Kane is a 43 year old male with history of chronic systolic heart failure due to NICM EF 20%, HTN, obesity, former smoker quit 2010, OSA on CPAP.  Admitted to [facility] in November 2014 with new-onset heart failure. ECHO EF 20%. Diuresed and started on HF meds. Discharge weight 295 pounds.   RHC/LHC 07/24/13  RA: 12  RV: 56/18/19  PA: 54/20 (40)  PCWP: 35  Fick CO/CI: 6.6/2.6  PVR: 0.9 Woods  Ao Sat: 89%  PA sats: 61% 64%  Left main: Calcified 20% stenosis  LAD: Gives off 2 diagonals. 30% proximal lesion distal vessel tapers with moderate diffuse plaque. No high grade lesions.  LCX: Nondominant. 30% prox to mid lesion. Small OM-1. Large OM-2. 40% prox OM-2  RCA: Dominant vessel. 40% tubular lesion in midsection. Large RV branch with 50% ostial lesion. . Large PDA.  LV-gram done in the RAO projection: Markedly dilated. Severe global HK Ejection fraction = 20-25%   Labs 11/09/23/12 K 4.5 Creatinine 0.85 Labs 07/26/13 K 3.4 Creatinine 0.76 SPEP No M spike Labs 07/24/13 Cholesterol 200 TG 132 HDL 42 LDL 132  Labs 07/23/13 TSH 2.2  Pro BNP 3783 Hemoglobin A1C 6 Labs 08/15/13 K 4.9 Creatinine 0.73  He returns for follow up.  Had a respiratory virus last week. Now feeling better. Last visit carvedilol was increased to 1.25 mg twice a day without difficulty.  Denies SOB/PND/Orthopnea/CP. Uses CPAP nightly.  Weight at home 309 pounds (up from 305). Hasn't taken extra lasix. Was walking every other day for 15-20 minutes but stopped while he was sick.  Taking all medications.  Working full time at [employer].   ROS: All systems negative except as listed in HPI, PMH and Problem List.  Past Medical History  Diagnosis Date  . Hypertension   . Lumbar disc disease   . Morbidly obese   . Cardiomyopathy     Current Outpatient Prescriptions   Medication Sig Dispense Refill  . aspirin 81 MG EC tablet Take 1 tablet (81 mg total) by mouth daily.  30 tablet  10  . atorvastatin (LIPITOR) 40 MG tablet Take 1 tablet (40 mg total) by mouth daily at 6 PM.  30 tablet  10  . carvedilol (COREG) 12.5 MG tablet Take 1 tablet (12.5 mg total) by mouth 2 (two) times daily with a meal.  60 tablet  3  . digoxin (LANOXIN) 0.25 MG tablet Take 1 tablet (0.25 mg total) by mouth daily.  30 tablet  10  . furosemide (LASIX) 40 MG tablet Take 1 tablet (40 mg total) by mouth daily.  30 tablet  10  . lisinopril (PRINIVIL,ZESTRIL) 10 MG tablet Take 1 tablet (10 mg total) by mouth 2 (two) times daily.  60 tablet  3  . spironolactone (ALDACTONE) 25 MG tablet Take 0.5 tablets (12.5 mg total) by mouth daily.  30 tablet  6   No current facility-administered medications for this encounter.     PHYSICAL EXAM: Filed Vitals:   10/04/13 1545  BP: 140/90  Pulse: 98  Weight: 313 lb (141.976 kg)  SpO2: 95%    General:  Well appearing. No resp difficulty HEENT: normal Neck: supple. JVP difficult to assess due to body habitus appears ~9.  Carotids 2+ bilaterally; no bruits. No lymphadenopathy or thryomegaly appreciated. Cor: PMI normal. Regular  rate & rhythm. No rubs, gallops or murmurs. Lungs: clear Abdomen: Obese, soft, nontender, nondistended. No hepatosplenomegaly. No bruits or masses. Good bowel sounds. Extremities: no cyanosis, clubbing, rash, 2+ edema Neuro: alert & orientedx3, cranial nerves grossly intact. Moves all 4 extremities w/o difficulty. Affect pleasant.    ASSESSMENT & PLAN: -Enrolled in [employer] IT study. 1. Chronic Systolic heart Failure. NICM 07/2013 EF 20% NYHA I-II  -Volume status up. Continue lasix 40 mg daily and 12.5 mg spironolactone daily. Will have him take extra lasix today and tomorrow. Also take extra lasix as needed to keep weight under 306.  - Continue carvedilol to 12.5 mg twice a day. Continue digoxin 0.25 mg daily - increase  lisinopril to 20 mg daily. Check BMET in 1 week. Watch K+ closely.  K from 11/25 was 4.9  - Reinforced daily weights, low salt food choices, and limiting fluid intake to < 2 liters per day.  - Urged him to get back to exercise efforts.  Plan to repeat ECHO in 3 months after HF meds optimized. (Early February 2014) 2. OSA- Continue CPAP nightly   3. Obesity-  Encourage to exercise and limit his portions.   4. CAD- Left main: Calcified 20% stenosis LAD: Gives off 2 diagonals. 30% proximal lesion distal LCX: Nondominant. 30% prox to mid lesion. Small OM-1. Large OM-2. 40% prox OM-2 RCA: Dominant vessel. 40% tubular lesion in midsection. Large RV branch with 50% ostial lesion No evidence of ischemia. Continue baby aspirin and atorvastatin..    Follow up in 3 weeks and continue to titrate medications.   Glori Bickers MD 3:48 PM

## 2013-10-12 ENCOUNTER — Other Ambulatory Visit (INDEPENDENT_AMBULATORY_CARE_PROVIDER_SITE_OTHER): Payer: Commercial Managed Care - PPO

## 2013-10-12 ENCOUNTER — Other Ambulatory Visit: Payer: Commercial Managed Care - PPO

## 2013-10-12 DIAGNOSIS — I5022 Chronic systolic (congestive) heart failure: Secondary | ICD-10-CM

## 2013-10-12 NOTE — Addendum Note (Signed)
Encounter addended by: Scarlette Calico, RN on: 10/12/2013  4:16 PM<BR>     Documentation filed: Orders

## 2013-10-13 LAB — BASIC METABOLIC PANEL
BUN: 15 mg/dL (ref 6–23)
CHLORIDE: 99 meq/L (ref 96–112)
CO2: 29 mEq/L (ref 19–32)
CREATININE: 0.7 mg/dL (ref 0.4–1.5)
Calcium: 9.2 mg/dL (ref 8.4–10.5)
GFR: 122.08 mL/min (ref >60.00)
Glucose, Bld: 161 mg/dL — ABNORMAL HIGH (ref 70–99)
POTASSIUM: 4.3 meq/L (ref 3.5–5.1)
Sodium: 135 mEq/L (ref 135–145)

## 2013-10-26 ENCOUNTER — Encounter (HOSPITAL_COMMUNITY): Payer: Self-pay

## 2013-10-26 ENCOUNTER — Ambulatory Visit (HOSPITAL_COMMUNITY)
Admission: RE | Admit: 2013-10-26 | Discharge: 2013-10-26 | Disposition: A | Discharge status: Home / Self Care | Payer: Commercial Managed Care - PPO | Source: Ambulatory Visit | Attending: Internal Medicine | Admitting: Internal Medicine

## 2013-10-26 VITALS — BP 126/74 | HR 97 | Ht 70.0 in | Wt 314.1 lb

## 2013-10-26 DIAGNOSIS — E669 Obesity, unspecified: Secondary | ICD-10-CM | POA: Insufficient documentation

## 2013-10-26 DIAGNOSIS — I10 Essential (primary) hypertension: Secondary | ICD-10-CM | POA: Insufficient documentation

## 2013-10-26 DIAGNOSIS — I428 Other cardiomyopathies: Secondary | ICD-10-CM | POA: Insufficient documentation

## 2013-10-26 DIAGNOSIS — Z79899 Other long term (current) drug therapy: Secondary | ICD-10-CM | POA: Insufficient documentation

## 2013-10-26 DIAGNOSIS — I2510 Atherosclerotic heart disease of native coronary artery without angina pectoris: Secondary | ICD-10-CM | POA: Insufficient documentation

## 2013-10-26 DIAGNOSIS — I5022 Chronic systolic (congestive) heart failure: Secondary | ICD-10-CM | POA: Insufficient documentation

## 2013-10-26 DIAGNOSIS — Z7982 Long term (current) use of aspirin: Secondary | ICD-10-CM | POA: Insufficient documentation

## 2013-10-26 DIAGNOSIS — Z87891 Personal history of nicotine dependence: Secondary | ICD-10-CM | POA: Insufficient documentation

## 2013-10-26 MED ORDER — CARVEDILOL 12.5 MG PO TABS: 18.7500 mg | ORAL_TABLET | Freq: Two times a day (BID) | ORAL | Status: DC

## 2013-10-26 NOTE — Patient Instructions (Signed)
Follow up in 3 weeks with an ECHO  Take carvedilol 18.75 mg twice a day  Do the following things EVERYDAY: 1) Weigh yourself in the morning before breakfast. Write it down and keep it in a log. 2) Take your medicines as prescribed 3) Eat low salt foods-Limit salt (sodium) to 2000 mg per day.  4) Stay as active as you can everyday 5) Limit all fluids for the day to less than 2 liters

## 2013-10-26 NOTE — Progress Notes (Signed)
Patient ID: Stephen Kane, male   DOB: Jan 13, 1971, 43 y.o.   MRN: [id number]   Weight Range    Baseline proBNP   GUIDE IT PATIENT PCP: none HPI: Stephen Kane is a 43 year old male with history of chronic systolic heart failure due to NICM EF 20%, HTN, obesity, former smoker quit 2010, OSA on CPAP.  Admitted to [facility] in November 2014 with new-onset heart failure. ECHO EF 20%. Diuresed and started on HF meds. Discharge weight 295 pounds.   RHC/LHC 07/24/13  RA: 12  RV: 56/18/19  PA: 54/20 (40)  PCWP: 35  Fick CO/CI: 6.6/2.6  PVR: 0.9 Woods  Ao Sat: 89%  PA sats: 61% 64%  Left main: Calcified 20% stenosis  LAD: Gives off 2 diagonals. 30% proximal lesion distal vessel tapers with moderate diffuse plaque. No high grade lesions.  LCX: Nondominant. 30% prox to mid lesion. Small OM-1. Large OM-2. 40% prox OM-2  RCA: Dominant vessel. 40% tubular lesion in midsection. Large RV branch with 50% ostial lesion. . Large PDA.  LV-gram done in the RAO projection: Markedly dilated. Severe global HK Ejection fraction = 20-25%   Labs 11/09/23/12 K 4.5 Creatinine 0.85 Labs 07/26/13 K 3.4 Creatinine 0.76 SPEP No M spike Labs 07/24/13 Cholesterol 200 TG 132 HDL 42 LDL 132  Labs 07/23/13 TSH 2.2  Pro BNP 3783 Hemoglobin A1C 6 Labs 08/15/13 K 4.9 Creatinine 0.73 Labs 09/07/13 Dig level 0.7  Labs 10/12/2013 K 4.3 Creatinine 0.7   He returns for follow up.  Last visit lisinopril was increased to 20 mg twice a day. Overall he feels great. Denies SOB/PND/Orthopnea. Walking about 20 minutes 3-4 days. Compliant with medications. Weight at home 309 pounds. He has not had extra lasix.  Tries to follow low salt diet.    ROS: All systems negative except as listed in HPI, PMH and Problem List.  Past Medical History  Diagnosis Date  . Hypertension   . Lumbar disc disease   . Morbidly obese   . Cardiomyopathy     Current Outpatient Prescriptions  Medication Sig Dispense Refill  . aspirin 81 MG EC tablet Take 1 tablet  (81 mg total) by mouth daily.  30 tablet  10  . atorvastatin (LIPITOR) 40 MG tablet Take 1 tablet (40 mg total) by mouth daily at 6 PM.  30 tablet  10  . carvedilol (COREG) 12.5 MG tablet Take 1 tablet (12.5 mg total) by mouth 2 (two) times daily with a meal.  60 tablet  3  . digoxin (LANOXIN) 0.25 MG tablet Take 1 tablet (0.25 mg total) by mouth daily.  30 tablet  10  . furosemide (LASIX) 40 MG tablet Take 1 tablet (40 mg total) by mouth daily.  30 tablet  10  . lisinopril (PRINIVIL,ZESTRIL) 20 MG tablet Take 1 tablet (20 mg total) by mouth 2 (two) times daily.  60 tablet  3  . spironolactone (ALDACTONE) 25 MG tablet Take 0.5 tablets (12.5 mg total) by mouth daily.  30 tablet  6   No current facility-administered medications for this encounter.     PHYSICAL EXAM: Filed Vitals:   10/26/13 1457  BP: 126/74  Pulse: 97  Height: 5\' 10"  (1.778 m)  Weight: 314 lb 1.9 oz (142.484 kg)  SpO2: 96%    General:  Well appearing. No resp difficulty HEENT: normal Neck: supple. JVP difficult to assess due to body habitus but does not appear elevated.   Carotids 2+ bilaterally; no bruits. No lymphadenopathy or thryomegaly appreciated.  Cor: PMI normal. Regular rate & rhythm. No rubs, gallops or murmurs. Lungs: clear Abdomen: Obese, soft, nontender, nondistended. No hepatosplenomegaly. No bruits or masses. Good bowel sounds. Extremities: no cyanosis, clubbing, rash,  edema Neuro: alert & orientedx3, cranial nerves grossly intact. Moves all 4 extremities w/o difficulty. Affect pleasant.    ASSESSMENT & PLAN: -Enrolled in Guide IT study. 1. Chronic Systolic heart Failure. NICM 07/2013 EF 20% NYHA I -Volume status stable. Continue lasix 40 mg daily and 12.5 mg spironolactone daily.  - Increase carvedilol to 18.75 mg twice a day. Continue digoxin 0.25 mg daily (dig level 0.7 )  - Continue lisinopril to 20 mg twice a day. On goal dose - Reinforced daily weights, low salt food choices, and limiting  fluid intake to < 2 liters per day.  Repeat ECHO  2. OSA- Continue CPAP nightly   3. Obesity-  Encourage to exercise and limit his portions.   4. CAD- Left main: Calcified 20% stenosis LAD: Gives off 2 diagonals. 30% proximal lesion distal LCX: Nondominant. 30% prox to mid lesion. Small OM-1. Large OM-2. 40% prox OM-2 RCA: Dominant vessel. 40% tubular lesion in midsection. Large RV branch with 50% ostial lesion. No evidence of ischemia. Continue baby aspirin and atorvastatin..    Follow up in 3 weeks with an ECHO   CLEGG,AMY NP-C 2:58 PM  Patient seen and examined with [name], NP. We discussed all aspects of the encounter. I agree with the assessment and plan as stated above.   He continues to do well. NYHA II. Increase carvedilol. Reinforced need for daily weights and reviewed use of sliding scale diuretics. F/u in 3-4 weeks with repeat echo to assess need for ICD.   Daniel Bensimhon,MD 11:27 AM

## 2013-11-20 ENCOUNTER — Ambulatory Visit (HOSPITAL_COMMUNITY)
Admission: RE | Admit: 2013-11-20 | Discharge: 2013-11-20 | Disposition: A | Discharge status: Home / Self Care | Payer: Commercial Managed Care - PPO | Source: Ambulatory Visit | Attending: Internal Medicine | Admitting: Internal Medicine

## 2013-11-20 ENCOUNTER — Ambulatory Visit (HOSPITAL_BASED_OUTPATIENT_CLINIC_OR_DEPARTMENT_OTHER)
Admission: RE | Admit: 2013-11-20 | Discharge: 2013-11-20 | Disposition: A | Discharge status: Home / Self Care | Payer: Commercial Managed Care - PPO | Source: Ambulatory Visit | Attending: Internal Medicine | Admitting: Internal Medicine

## 2013-11-20 VITALS — BP 120/70 | HR 82 | Wt 315.0 lb

## 2013-11-20 DIAGNOSIS — I517 Cardiomegaly: Secondary | ICD-10-CM

## 2013-11-20 DIAGNOSIS — I509 Heart failure, unspecified: Secondary | ICD-10-CM | POA: Insufficient documentation

## 2013-11-20 DIAGNOSIS — I5022 Chronic systolic (congestive) heart failure: Secondary | ICD-10-CM

## 2013-11-20 NOTE — Progress Notes (Signed)
  Echocardiogram 2D Echocardiogram has been performed.  Stephen Kane [name] 11/20/2013, 1:51 PM

## 2013-11-20 NOTE — Patient Instructions (Signed)
Stop digoxin  Follow up in 3-4 months   Do the following things EVERYDAY: 1) Weigh yourself in the morning before breakfast. Write it down and keep it in a log. 2) Take your medicines as prescribed 3) Eat low salt foods-Limit salt (sodium) to 2000 mg per day.  4) Stay as active as you can everyday 5) Limit all fluids for the day to less than 2 liters

## 2013-11-20 NOTE — Progress Notes (Signed)
Patient ID: Stephen Kane, male   DOB: 27-Mar-1971, 43 y.o.   MRN: [id number]   Weight Range    Baseline proBNP   GUIDE IT PATIENT PCP: none Cardiologist: None   HPI: Stephen Kane is a 43 year old male with history of chronic systolic heart failure due to NICM EF 20%, HTN, obesity, former smoker quit 2010, OSA on CPAP.  Admitted to [facility] in November 2014 with new-onset heart failure. ECHO EF 20%. Diuresed and started on HF meds. Discharge weight 295 pounds.   RHC/LHC 07/24/13  RA: 12  RV: 56/18/19  PA: 54/20 (40)  PCWP: 35  Fick CO/CI: 6.6/2.6  PVR: 0.9 Woods  Ao Sat: 89%  PA sats: 61% 64%  Left main: Calcified 20% stenosis  LAD: Gives off 2 diagonals. 30% proximal lesion distal vessel tapers with moderate diffuse plaque. No high grade lesions.  LCX: Nondominant. 30% prox to mid lesion. Small OM-1. Large OM-2. 40% prox OM-2  RCA: Dominant vessel. 40% tubular lesion in midsection. Large RV branch with 50% ostial lesion. . Large PDA.  LV-gram done in the RAO projection: Markedly dilated. Severe global HK Ejection fraction = 20-25%   Labs 11/09/23/12 K 4.5 Creatinine 0.85 Labs 07/26/13 K 3.4 Creatinine 0.76 SPEP No M spike Labs 07/24/13 Cholesterol 200 TG 132 HDL 42 LDL 132  Labs 07/23/13 TSH 2.2  Pro BNP 3783 Hemoglobin A1C 6 Labs 08/15/13 K 4.9 Creatinine 0.73 Labs 09/07/13 Dig level 0.7  Labs 10/12/2013 K 4.3 Creatinine 0.7   ECHO 11/20/13 - EF 50-55%    He returns for follow up.  Last visit carvedilol was increased to 18.75 mg twice a day. Denies SOB/PND/Orthopnea/CP.  Using CPAP. Compliant with medications. Not exercising.    ROS: All systems negative except as listed in HPI, PMH and Problem List.  Past Medical History  Diagnosis Date  . Hypertension   . Lumbar disc disease   . Morbidly obese   . Cardiomyopathy     Current Outpatient Prescriptions  Medication Sig Dispense Refill  . atorvastatin (LIPITOR) 40 MG tablet Take 1 tablet (40 mg total) by mouth daily at 6 PM.  30 tablet   10  . carvedilol (COREG) 12.5 MG tablet Take 1.5 tablets (18.75 mg total) by mouth 2 (two) times daily with a meal.  90 tablet  3  . digoxin (LANOXIN) 0.25 MG tablet Take 1 tablet (0.25 mg total) by mouth daily.  30 tablet  10  . furosemide (LASIX) 40 MG tablet Take 1 tablet (40 mg total) by mouth daily.  30 tablet  10  . lisinopril (PRINIVIL,ZESTRIL) 20 MG tablet Take 1 tablet (20 mg total) by mouth 2 (two) times daily.  60 tablet  3  . spironolactone (ALDACTONE) 25 MG tablet Take 0.5 tablets (12.5 mg total) by mouth daily.  30 tablet  6  . aspirin 81 MG EC tablet Take 1 tablet (81 mg total) by mouth daily.  30 tablet  10   No current facility-administered medications for this encounter.     PHYSICAL EXAM: Filed Vitals:   11/20/13 1421  BP: 120/70  Pulse: 82  Weight: 315 lb (142.883 kg)  SpO2: 96%    General:  Well appearing. No resp difficulty HEENT: normal Neck: supple. JVP difficult to assess due to body habitus but does not appear elevated.   Carotids 2+ bilaterally; no bruits. No lymphadenopathy or thryomegaly appreciated. Cor: PMI normal. Regular rate & rhythm. No rubs, gallops or murmurs. Lungs: clear Abdomen: Obese, soft, nontender, nondistended.  No hepatosplenomegaly. No bruits or masses. Good bowel sounds. Extremities: no cyanosis, clubbing, rash,  edema Neuro: alert & orientedx3, cranial nerves grossly intact. Moves all 4 extremities w/o difficulty. Affect pleasant.    ASSESSMENT & PLAN: -Enrolled in Guide IT study. 1. Chronic Systolic heart Failure. NICM 07/2013 EF 20%> now recovered to low normal 50-55%  NYHA I. Volume status stable. Continue lasix 40 mg daily and 12.5 mg spironolactone daily.  - Continue carvedilol to 18.75 mg twice a day. Stop digoxin EF recovered. - Continue lisinopril to 20 mg twice a day. On goal dose - Reinforced daily weights, low salt food choices, and limiting fluid intake to < 2 liters per day.  2. OSA- Continue CPAP nightly   3. Obesity-   Encourage to exercise and limit his portions.   4. CAD- Left main: Calcified 20% stenosis LAD: Gives off 2 diagonals. 30% proximal lesion distal LCX: Nondominant. 30% prox to mid lesion. Small OM-1. Large OM-2. 40% prox OM-2 RCA: Dominant vessel. 40% tubular lesion in midsection. Large RV branch with 50% ostial lesion. No evidence of ischemia. Continue baby aspirin and atorvastatin..    Follow up in 3-4 months   CLEGG,AMY NP-C   Patient seen and examined with [name], NP. We discussed all aspects of the encounter. I agree with the assessment and plan as stated above.  I reviewed echo personally and discussed with him.. EF now essentially recovered. He is doing very well. Agree with medica regimen as above.   2:25 PM

## 2013-12-01 ENCOUNTER — Encounter: Payer: Self-pay | Admitting: Internal Medicine

## 2013-12-15 ENCOUNTER — Encounter: Payer: Self-pay | Admitting: Internal Medicine

## 2013-12-18 NOTE — Addendum Note (Signed)
Encounter addended by: Jolaine Artist, MD on: 12/18/2013  3:50 PM<BR>     Documentation filed: Visit Diagnoses, Follow-up Section, LOS Section, Notes Section

## 2014-01-25 ENCOUNTER — Other Ambulatory Visit (HOSPITAL_COMMUNITY): Payer: Self-pay | Admitting: Internal Medicine

## 2014-01-31 ENCOUNTER — Ambulatory Visit (HOSPITAL_COMMUNITY)
Admission: RE | Admit: 2014-01-31 | Discharge: 2014-01-31 | Disposition: A | Discharge status: Home / Self Care | Payer: Commercial Managed Care - PPO | Source: Ambulatory Visit | Attending: Internal Medicine | Admitting: Internal Medicine

## 2014-01-31 ENCOUNTER — Encounter (HOSPITAL_COMMUNITY): Payer: Self-pay

## 2014-01-31 VITALS — BP 154/110 | HR 114 | Wt 318.8 lb

## 2014-01-31 DIAGNOSIS — G4733 Obstructive sleep apnea (adult) (pediatric): Secondary | ICD-10-CM

## 2014-01-31 DIAGNOSIS — I2510 Atherosclerotic heart disease of native coronary artery without angina pectoris: Secondary | ICD-10-CM

## 2014-01-31 DIAGNOSIS — I428 Other cardiomyopathies: Secondary | ICD-10-CM | POA: Insufficient documentation

## 2014-01-31 DIAGNOSIS — E669 Obesity, unspecified: Secondary | ICD-10-CM | POA: Insufficient documentation

## 2014-01-31 DIAGNOSIS — I5022 Chronic systolic (congestive) heart failure: Secondary | ICD-10-CM

## 2014-01-31 DIAGNOSIS — I10 Essential (primary) hypertension: Secondary | ICD-10-CM

## 2014-01-31 DIAGNOSIS — Z9989 Dependence on other enabling machines and devices: Secondary | ICD-10-CM

## 2014-01-31 LAB — LIPID PANEL
CHOL/HDL RATIO: 3.6 ratio
Cholesterol: 164 mg/dL (ref 0–200)
HDL: 46 mg/dL (ref >39)
LDL Cholesterol: 71 mg/dL (ref 0–99)
Triglycerides: 235 mg/dL — ABNORMAL HIGH (ref <150)
VLDL: 47 mg/dL — AB (ref 0–40)

## 2014-01-31 LAB — BASIC METABOLIC PANEL
BUN: 15 mg/dL (ref 6–23)
CO2: 30 mEq/L (ref 19–32)
Calcium: 10.4 mg/dL (ref 8.4–10.5)
Chloride: 95 mEq/L — ABNORMAL LOW (ref 96–112)
Creatinine, Ser: 0.91 mg/dL (ref 0.50–1.35)
Glucose, Bld: 205 mg/dL — ABNORMAL HIGH (ref 70–99)
POTASSIUM: 4.5 meq/L (ref 3.7–5.3)
Sodium: 141 mEq/L (ref 137–147)

## 2014-01-31 NOTE — Patient Instructions (Signed)
Labs today  Your physician recommends that you schedule a follow-up appointment in: 4 months  Do the following things EVERYDAY: 1) Weigh yourself in the morning before breakfast. Write it down and keep it in a log. 2) Take your medicines as prescribed 3) Eat low salt foods-Limit salt (sodium) to 2000 mg per day.  4) Stay as active as you can everyday 5) Limit all fluids for the day to less than 2 liters 6)   

## 2014-02-01 DIAGNOSIS — I10 Essential (primary) hypertension: Secondary | ICD-10-CM | POA: Insufficient documentation

## 2014-02-01 DIAGNOSIS — I2510 Atherosclerotic heart disease of native coronary artery without angina pectoris: Secondary | ICD-10-CM | POA: Insufficient documentation

## 2014-02-01 NOTE — Progress Notes (Signed)
Patient ID: Stephen Kane, male   DOB: 1971-02-17, 43 y.o.   MRN: [id number]  Weight Range    Baseline proBNP   GUIDE IT PATIENT PCP: none Cardiologist: None   HPI: Stephen Kane is a 42 year old male with history of chronic systolic heart failure due to NICM EF 20%, HTN, obesity, former smoker quit 2010, OSA on CPAP.  Admitted to [facility] in November 2014 with new-onset heart failure. ECHO EF 20%. Diuresed and started on HF meds. Discharge weight 295 pounds.   RHC/LHC 07/24/13  RA: 12  RV: 56/18/19  PA: 54/20 (40)  PCWP: 35  Fick CO/CI: 6.6/2.6  PVR: 0.9 Woods  Ao Sat: 89%  PA sats: 61% 64%  Left main: Calcified 20% stenosis  LAD: Gives off 2 diagonals. 30% proximal lesion distal vessel tapers with moderate diffuse plaque. No high grade lesions.  LCX: Nondominant. 30% prox to mid lesion. Small OM-1. Large OM-2. 40% prox OM-2  RCA: Dominant vessel. 40% tubular lesion in midsection. Large RV branch with 50% ostial lesion. . Large PDA.  LV-gram done in the RAO projection: Markedly dilated. Severe global HK Ejection fraction = 20-25%   ECHO 11/20/13 - EF 50-55%    Patient has been doing well symptomatically.  He walks daily for 1.8 miles without dyspnea.  His HR and BP were up today when he arrived but he had walked a long distance in the heat.  He has not lost weight.  No orthopnea/PND. He is having trouble with his CPAP machine and has not had it adjusted for years.  No chest pain.   Labs 11/09/23/12 K 4.5 Creatinine 0.85 Labs 07/26/13 K 3.4 Creatinine 0.76 SPEP No M spike Labs 07/24/13 Cholesterol 200 TG 132 HDL 42 LDL 132  Labs 07/23/13 TSH 2.2  Pro BNP 3783 Hemoglobin A1C 6 Labs 08/15/13 K 4.9 Creatinine 0.73 Labs 09/07/13 Dig level 0.7  Labs 10/12/2013 K 4.3 Creatinine 0.7  Labs 3/15 K 4.5, creatinine 0.82   ROS: All systems negative except as listed in HPI, PMH and Problem List.  Past Medical History  Diagnosis Date  . Hypertension   . Lumbar disc disease   . Morbidly obese   .  Cardiomyopathy     Current Outpatient Prescriptions  Medication Sig Dispense Refill  . aspirin 81 MG EC tablet Take 1 tablet (81 mg total) by mouth daily.  30 tablet  10  . atorvastatin (LIPITOR) 40 MG tablet Take 1 tablet (40 mg total) by mouth daily at 6 PM.  30 tablet  10  . carvedilol (COREG) 12.5 MG tablet Take 1.5 tablets (18.75 mg total) by mouth 2 (two) times daily with a meal.  90 tablet  3  . cetirizine (ZYRTEC) 10 MG tablet Take 10 mg by mouth daily.      . furosemide (LASIX) 40 MG tablet Take 1 tablet (40 mg total) by mouth daily.  30 tablet  10  . lisinopril (PRINIVIL,ZESTRIL) 20 MG tablet TAKE ONE TABLET BY MOUTH TWICE DAILY   60 tablet  3  . spironolactone (ALDACTONE) 25 MG tablet Take 0.5 tablets (12.5 mg total) by mouth daily.  30 tablet  6   No current facility-administered medications for this encounter.     PHYSICAL EXAM: Filed Vitals:   01/31/14 1426  BP: 154/110  Pulse: 114  Weight: 318 lb 12.8 oz (144.607 kg)  SpO2: 98%    General:  Well appearing. No resp difficulty HEENT: normal Neck: supple. JVP difficult to assess due to body  habitus but does not appear elevated.   Carotids 2+ bilaterally; no bruits. No lymphadenopathy or thryomegaly appreciated. Cor: PMI normal. Regular rate & rhythm. No rubs, gallops or murmurs. Lungs: clear Abdomen: Obese, soft, nontender, nondistended. No hepatosplenomegaly. No bruits or masses. Good bowel sounds. Extremities: no cyanosis, clubbing, rash,  edema Neuro: alert & orientedx3, cranial nerves grossly intact. Moves all 4 extremities w/o difficulty. Affect pleasant.  ASSESSMENT & PLAN: -Enrolled in [employer] study. 1. Chronic Systolic heart Failure: nonischemic cardiomyopathy.  Echo 07/2013 EF 20% now recovered to low normal 50-55%  NYHA I-II. Volume status stable.  - Continue lasix 40 mg daily and 12.5 mg spironolactone daily.  - Continue carvedilol to 18.75 mg twice a day.  - Continue lisinopril to 20 mg twice a day. On  goal dose - Will need BMET in 1 month.  - Reinforced daily weights, low salt food choices, and limiting fluid intake to < 2 liters per day.  2. OSA: Continue CPAP nightly. He has been having trouble with his CPAP machine.  I will refer to sleep medicine.  3. Obesity:  Encourage to exercise and limit his portions.   4. CAD: LHC with extensive plaque but nonobstructive.  No ischemic symptoms. Continue baby aspirin and atorvastatin.  Will get lipids with labs in 1 month.  5. HTN: BP high today but he has walked a long distance.  I asked him to monitor BP at home and to call in if readings are high.    Followup in 4 months.   Larey Dresser 02/01/2014

## 2014-02-02 ENCOUNTER — Telehealth: Payer: Self-pay | Admitting: *Deleted

## 2014-02-02 NOTE — Telephone Encounter (Signed)
I called patient about lab work on 01/31/14. Glucose was 201 mg/dl. Amy Clegg asked me to call patient and tell him to follow-up with glucose which is 201 with primary doctor. Patient verbalized understanding.

## 2014-02-05 ENCOUNTER — Encounter (HOSPITAL_COMMUNITY): Payer: Self-pay | Admitting: *Deleted

## 2014-02-19 ENCOUNTER — Encounter: Payer: Self-pay | Admitting: Internal Medicine

## 2014-03-04 ENCOUNTER — Other Ambulatory Visit (HOSPITAL_COMMUNITY): Payer: Self-pay | Admitting: Adult Health

## 2014-05-03 ENCOUNTER — Ambulatory Visit (HOSPITAL_COMMUNITY)
Admission: RE | Admit: 2014-05-03 | Discharge: 2014-05-03 | Disposition: A | Discharge status: Home / Self Care | Payer: Commercial Managed Care - PPO | Source: Ambulatory Visit | Attending: Internal Medicine | Admitting: Internal Medicine

## 2014-05-03 ENCOUNTER — Encounter (HOSPITAL_COMMUNITY): Payer: Self-pay

## 2014-05-03 VITALS — BP 130/88 | HR 83 | Wt 320.1 lb

## 2014-05-03 DIAGNOSIS — G4733 Obstructive sleep apnea (adult) (pediatric): Secondary | ICD-10-CM

## 2014-05-03 DIAGNOSIS — I5022 Chronic systolic (congestive) heart failure: Secondary | ICD-10-CM | POA: Diagnosis not present

## 2014-05-03 DIAGNOSIS — I10 Essential (primary) hypertension: Secondary | ICD-10-CM | POA: Insufficient documentation

## 2014-05-03 DIAGNOSIS — Z7982 Long term (current) use of aspirin: Secondary | ICD-10-CM | POA: Diagnosis not present

## 2014-05-03 DIAGNOSIS — E669 Obesity, unspecified: Secondary | ICD-10-CM | POA: Insufficient documentation

## 2014-05-03 DIAGNOSIS — Z87891 Personal history of nicotine dependence: Secondary | ICD-10-CM | POA: Diagnosis not present

## 2014-05-03 DIAGNOSIS — M51379 Other intervertebral disc degeneration, lumbosacral region without mention of lumbar back pain or lower extremity pain: Secondary | ICD-10-CM | POA: Insufficient documentation

## 2014-05-03 DIAGNOSIS — M5137 Other intervertebral disc degeneration, lumbosacral region: Secondary | ICD-10-CM | POA: Insufficient documentation

## 2014-05-03 DIAGNOSIS — Z9989 Dependence on other enabling machines and devices: Secondary | ICD-10-CM

## 2014-05-03 DIAGNOSIS — I509 Heart failure, unspecified: Secondary | ICD-10-CM | POA: Insufficient documentation

## 2014-05-03 DIAGNOSIS — I2510 Atherosclerotic heart disease of native coronary artery without angina pectoris: Secondary | ICD-10-CM | POA: Insufficient documentation

## 2014-05-03 DIAGNOSIS — I428 Other cardiomyopathies: Secondary | ICD-10-CM | POA: Insufficient documentation

## 2014-05-03 NOTE — Addendum Note (Signed)
Encounter addended by: Kerry Dory, CMA on: 05/03/2014 12:06 PM<BR>     Documentation filed: Patient Instructions Section, Orders

## 2014-05-03 NOTE — Addendum Note (Signed)
Encounter addended by: Jolaine Artist, MD on: 05/03/2014 12:02 PM<BR>     Documentation filed: Notes Section

## 2014-05-03 NOTE — Progress Notes (Addendum)
Patient ID: Stephen Kane, male   DOB: 22-Feb-1971, 43 y.o.   MRN: [id number]   Weight Range    Baseline proBNP   GUIDE IT PATIENT PCP: none Cardiologist: None   HPI: Stephen Kane is a 43 year old male with history of chronic systolic heart failure due to NICM EF 20%, HTN, obesity, former smoker quit 2010, OSA on CPAP.  Admitted to [facility] in November 2014 with new-onset heart failure. ECHO EF 20%. Diuresed and started on HF meds. Discharge weight 295 pounds.   RHC/LHC 07/24/13  RA: 12  RV: 56/18/19  PA: 54/20 (40)  PCWP: 35  Fick CO/CI: 6.6/2.6  PVR: 0.9 Woods  Ao Sat: 89%  PA sats: 61% 64%  Left main: Calcified 20% stenosis  LAD: Gives off 2 diagonals. 30% proximal lesion distal vessel tapers with moderate diffuse plaque. No high grade lesions.  LCX: Nondominant. 30% prox to mid lesion. Small OM-1. Large OM-2. 40% prox OM-2  RCA: Dominant vessel. 40% tubular lesion in midsection. Large RV branch with 50% ostial lesion. . Large PDA.  LV-gram done in the RAO projection: Markedly dilated. Severe global HK Ejection fraction = 20-25%   ECHO 11/20/13 - EF 50-55%    Follow-up: Feels great. Walking 1-2 miles per day without dyspnea.  No CP or edema.  No orthopnea/PND. He is having trouble with his CPAP machine and has not had it adjusted for years.  Gaining weight.   Labs 11/09/23/12 K 4.5 Creatinine 0.85 Labs 07/26/13 K 3.4 Creatinine 0.76 SPEP No M spike Labs 07/24/13 Cholesterol 200 TG 132 HDL 42 LDL 132  Labs 07/23/13 TSH 2.2  Pro BNP 3783 Hemoglobin A1C 6 Labs 08/15/13 K 4.9 Creatinine 0.73 Labs 09/07/13 Dig level 0.7  Labs 10/12/2013 K 4.3 Creatinine 0.7  Labs 3/15 K 4.5, creatinine 0.82   ROS: All systems negative except as listed in HPI, PMH and Problem List.  Past Medical History  Diagnosis Date  . Hypertension   . Lumbar disc disease   . Morbidly obese   . Cardiomyopathy     Current Outpatient Prescriptions  Medication Sig Dispense Refill  . aspirin 81 MG EC tablet Take 1 tablet  (81 mg total) by mouth daily.  30 tablet  10  . atorvastatin (LIPITOR) 40 MG tablet Take 1 tablet (40 mg total) by mouth daily at 6 PM.  30 tablet  10  . carvedilol (COREG) 12.5 MG tablet TAKE ONE AND ONE-HALF TABLETS BY MOUTH TWICE DAILY WITH MEALS   90 tablet  2  . cetirizine (ZYRTEC) 10 MG tablet Take 10 mg by mouth daily.      . furosemide (LASIX) 40 MG tablet Take 1 tablet (40 mg total) by mouth daily.  30 tablet  10  . lisinopril (PRINIVIL,ZESTRIL) 20 MG tablet TAKE ONE TABLET BY MOUTH TWICE DAILY   60 tablet  3  . spironolactone (ALDACTONE) 25 MG tablet Take 0.5 tablets (12.5 mg total) by mouth daily.  30 tablet  6   No current facility-administered medications for this encounter.     PHYSICAL EXAM: Filed Vitals:   05/03/14 1107  BP: 130/88  Pulse: 83  Weight: 320 lb 1.9 oz (145.205 kg)  SpO2: 97%    General:  Well appearing. No resp difficulty HEENT: normal Neck: supple. JVP difficult to assess due to body habitus but does not appear elevated.   Carotids 2+ bilaterally; no bruits. No lymphadenopathy or thryomegaly appreciated. Cor: PMI normal. Regular rate & rhythm. No rubs, gallops or murmurs.  Lungs: clear Abdomen: Obese, soft, nontender, nondistended. No hepatosplenomegaly. No bruits or masses. Good bowel sounds. Extremities: no cyanosis, clubbing, rash,  edema Neuro: alert & orientedx3, cranial nerves grossly intact. Moves all 4 extremities w/o difficulty. Affect pleasant.  ASSESSMENT & PLAN: -Enrolled in [employer] study. 1. Chronic Systolic heart Failure: nonischemic cardiomyopathy.  Echo 07/2013 EF 20% now recovered to low normal 50-55%  NYHA I. Volume status stable.  - EF has recovered -stable on current regimen. will continue. - Reinforced daily weights, low salt food choices, and limiting fluid intake to < 2 liters per day.  - Blood work today with GUIDE-IT 2. OSA: Continue CPAP nightly.  3. Obesity:  Encourage to exercise and limit his portions.  Suggested [place] for weight loss.  4. CAD: LHC with extensive plaque but nonobstructive.  No ischemic symptoms. Continue baby aspirin and atorvastatin.  Check fasting lipids next month.  5. HTN: Stable  Followup in 4 months with repeat ECHO  Stephen Kane 05/03/2014

## 2014-05-03 NOTE — Patient Instructions (Signed)
Labs needed in one month  Your physician has requested that you have an echocardiogram. Echocardiography is a painless test that uses sound waves to create images of your heart. It provides your doctor with information about the size and shape of your heart and how well your heart's chambers and valves are working. This procedure takes approximately one hour. There are no restrictions for this procedure.  Your physician recommends that you schedule a follow-up appointment in: 4 months with an echocardiogram  Do the following things EVERYDAY: 1) Weigh yourself in the morning before breakfast. Write it down and keep it in a log. 2) Take your medicines as prescribed 3) Eat low salt foods-Limit salt (sodium) to 2000 mg per day.  4) Stay as active as you can everyday 5) Limit all fluids for the day to less than 2 liters 6)

## 2014-05-11 ENCOUNTER — Encounter: Payer: Self-pay | Admitting: Internal Medicine

## 2014-07-03 ENCOUNTER — Other Ambulatory Visit (HOSPITAL_COMMUNITY): Payer: Self-pay | Admitting: Internal Medicine

## 2014-08-06 ENCOUNTER — Ambulatory Visit (HOSPITAL_COMMUNITY): Payer: Commercial Managed Care - PPO | Attending: Internal Medicine

## 2014-08-06 ENCOUNTER — Inpatient Hospital Stay (HOSPITAL_COMMUNITY): Admission: RE | Admit: 2014-08-06 | Payer: Commercial Managed Care - PPO | Source: Ambulatory Visit

## 2014-08-30 ENCOUNTER — Encounter (HOSPITAL_COMMUNITY): Payer: Self-pay | Admitting: Internal Medicine

## 2014-09-07 ENCOUNTER — Other Ambulatory Visit (HOSPITAL_COMMUNITY): Payer: Self-pay | Admitting: Adult Health

## 2014-09-07 ENCOUNTER — Other Ambulatory Visit (HOSPITAL_COMMUNITY): Payer: Self-pay | Admitting: Internal Medicine

## 2014-11-02 ENCOUNTER — Encounter (HOSPITAL_COMMUNITY): Payer: Commercial Managed Care - PPO

## 2014-12-17 ENCOUNTER — Ambulatory Visit (HOSPITAL_COMMUNITY)
Admission: RE | Admit: 2014-12-17 | Discharge: 2014-12-17 | Disposition: A | Discharge status: Home / Self Care | Payer: Commercial Managed Care - PPO | Source: Ambulatory Visit | Attending: Cardiology | Admitting: Cardiology

## 2014-12-17 ENCOUNTER — Encounter (HOSPITAL_COMMUNITY): Payer: Self-pay

## 2014-12-17 VITALS — BP 122/76 | HR 87 | Wt 320.5 lb

## 2014-12-17 DIAGNOSIS — I429 Cardiomyopathy, unspecified: Secondary | ICD-10-CM | POA: Diagnosis not present

## 2014-12-17 DIAGNOSIS — Z79899 Other long term (current) drug therapy: Secondary | ICD-10-CM | POA: Diagnosis not present

## 2014-12-17 DIAGNOSIS — Z9989 Dependence on other enabling machines and devices: Secondary | ICD-10-CM

## 2014-12-17 DIAGNOSIS — G4733 Obstructive sleep apnea (adult) (pediatric): Secondary | ICD-10-CM | POA: Insufficient documentation

## 2014-12-17 DIAGNOSIS — E669 Obesity, unspecified: Secondary | ICD-10-CM | POA: Insufficient documentation

## 2014-12-17 DIAGNOSIS — I10 Essential (primary) hypertension: Secondary | ICD-10-CM | POA: Diagnosis not present

## 2014-12-17 DIAGNOSIS — I5022 Chronic systolic (congestive) heart failure: Secondary | ICD-10-CM | POA: Diagnosis not present

## 2014-12-17 DIAGNOSIS — I2510 Atherosclerotic heart disease of native coronary artery without angina pectoris: Secondary | ICD-10-CM | POA: Diagnosis not present

## 2014-12-17 DIAGNOSIS — Z7982 Long term (current) use of aspirin: Secondary | ICD-10-CM | POA: Insufficient documentation

## 2014-12-17 DIAGNOSIS — Z87891 Personal history of nicotine dependence: Secondary | ICD-10-CM | POA: Diagnosis not present

## 2014-12-17 NOTE — Progress Notes (Signed)
Patient ID: Stephen Kane, male   DOB: 09/23/1970, 44 y.o.   MRN: [id number]  GUIDE IT PATIENT PCP:  Cardiologist: None   HPI: Stephen Kane is a 44 years old male with history of chronic systolic heart failure due to NICM EF 20%, HTN, obesity, former smoker quit 2010, OSA on CPAP.  Admitted to [facility] in November 2014 with new-onset heart failure. ECHO EF 20%. Diuresed and started on HF meds. Discharge weight 295 pounds.   RHC/LHC 07/24/13  RA: 12  RV: 56/18/19  PA: 54/20 (40)  PCWP: 35  Fick CO/CI: 6.6/2.6  PVR: 0.9 Woods  Ao Sat: 89%  PA sats: 61% 64%  Left main: Calcified 20% stenosis  LAD: Gives off 2 diagonals. 30% proximal lesion distal vessel tapers with moderate diffuse plaque. No high grade lesions.  LCX: Nondominant. 30% prox to mid lesion. Small OM-1. Large OM-2. 40% prox OM-2  RCA: Dominant vessel. 40% tubular lesion in midsection. Large RV branch with 50% ostial lesion. . Large PDA.  LV-gram done in the RAO projection: Markedly dilated. Severe global HK Ejection fraction = 20-25%   ECHO 11/20/13 - EF 50-55%    He returns for follow-up for HF.  Feels great. Able to walk 15,000 steps for multiple days in [place] without difficulty.  No CP or edema.  No orthopnea/PND. Using CPAP nightly.  Tried multiple weight loss diets but he says his weight has remained the same. Taking all medications. Had labs today with [employer].   Labs 11/09/23/12 K 4.5 Creatinine 0.85 Labs 07/26/13 K 3.4 Creatinine 0.76 SPEP No M spike Labs 07/24/13 Cholesterol 200 TG 132 HDL 42 LDL 132  Labs 07/23/13 TSH 2.2  Pro BNP 3783 Hemoglobin A1C 6 Labs 08/15/13 K 4.9 Creatinine 0.73 Labs 09/07/13 Dig level 0.7  Labs 10/12/2013 K 4.3 Creatinine 0.7  Labs 3/15 K 4.5, creatinine 0.82   ROS: All systems negative except as listed in HPI, PMH and Problem List.  Past Medical History  Diagnosis Date  . Hypertension   . Lumbar disc disease   . Morbidly obese   . Cardiomyopathy     Current Outpatient Prescriptions   Medication Sig Dispense Refill  . aspirin 81 MG EC tablet Take 1 tablet (81 mg total) by mouth daily. 30 tablet 10  . atorvastatin (LIPITOR) 40 MG tablet TAKE ONE TABLET BY MOUTH ONCE DAILY AT 6 PM  30 tablet 9  . carvedilol (COREG) 12.5 MG tablet TAKE ONE AND ONE-HALF TABLETS BY MOUTH TWICE DAILY with meals 90 tablet 1  . cetirizine (ZYRTEC) 10 MG tablet Take 10 mg by mouth daily.    . furosemide (LASIX) 40 MG tablet TAKE ONE TABLET BY MOUTH ONE TIME DAILY  30 tablet 9  . lisinopril (PRINIVIL,ZESTRIL) 20 MG tablet TAKE ONE TABLET BY MOUTH TWICE DAILY  60 tablet 2  . spironolactone (ALDACTONE) 25 MG tablet TAKE ONE-HALF TABLET BY MOUTH DAILY  15 tablet 5   No current facility-administered medications for this encounter.     PHYSICAL EXAM: Filed Vitals:   12/17/14 1458  BP: 122/76  Pulse: 87  Weight: 320 lb 8 oz (145.378 kg)  SpO2: 98%    General:  Well appearing. No resp difficulty HEENT: normal Neck: supple. JVP difficult to assess due to body habitus but does not appear elevated.   Carotids 2+ bilaterally; no bruits. No lymphadenopathy or thryomegaly appreciated. Cor: PMI normal. Regular rate & rhythm. No rubs, gallops or murmurs. Lungs: clear Abdomen: Obese, soft, nontender, nondistended. No hepatosplenomegaly.  No bruits or masses. Good bowel sounds. Extremities: no cyanosis, clubbing, rash,  edema Neuro: alert & orientedx3, cranial nerves grossly intact. Moves all 4 extremities w/o difficulty. Affect pleasant.  ASSESSMENT & PLAN: -Enrolled in Guide IT study. 1. Chronic Systolic heart Failure: nonischemic cardiomyopathy.  Echo 07/2013 EF 20% now recovered to low normal 50-55%  NYHA I. Volume status stable. Continue lasix 40 mg daily + 12.5 mg spiro daily Continue carvedilol 18.75 mg twice a day Continue lisinopril 20 mg twice a day. - Reinforced daily weights, low salt food choices, and limiting fluid intake to < 2 liters per day.  - Blood work today with GUIDE-IT 2. OSA:  Continue CPAP nightly.  3. Obesity:  Encourage to exercise and limit his portions.   4. CAD: LHC with extensive plaque but nonobstructive.  No ischemic symptoms. Continue baby aspirin and atorvastatin.   5. HTN: Stable  Follow up in 6 months with repeat ECHO. Referred to [facility] for PCP.   Kenzel Ruesch 12/17/2014

## 2014-12-17 NOTE — Patient Instructions (Signed)
Follow up in 6 months with an ECHO

## 2014-12-25 ENCOUNTER — Other Ambulatory Visit (HOSPITAL_COMMUNITY): Payer: Self-pay | Admitting: Internal Medicine

## 2014-12-26 ENCOUNTER — Telehealth: Payer: Self-pay | Admitting: Licensed Clinical Social Worker

## 2014-12-26 NOTE — Telephone Encounter (Signed)
CSW referred to assist with PCP. CSW contacted patient by phone and left message for return call to provide list of PCP options. 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 Raquel Sarna, [place]

## 2015-01-02 ENCOUNTER — Encounter: Payer: Self-pay | Admitting: Internal Medicine

## 2015-03-01 ENCOUNTER — Encounter: Payer: Self-pay | Admitting: Internal Medicine

## 2015-03-05 ENCOUNTER — Other Ambulatory Visit (HOSPITAL_COMMUNITY): Payer: Self-pay

## 2015-03-05 MED ORDER — ASPIRIN 81 MG PO TBEC: 81.0000 mg | DELAYED_RELEASE_TABLET | Freq: Every day | ORAL | Status: DC

## 2015-03-05 MED ORDER — ATORVASTATIN CALCIUM 40 MG PO TABS: ORAL_TABLET | ORAL | Status: DC

## 2015-03-05 MED ORDER — LISINOPRIL 20 MG PO TABS: 20.0000 mg | ORAL_TABLET | Freq: Two times a day (BID) | ORAL | Status: DC

## 2015-03-05 MED ORDER — FUROSEMIDE 40 MG PO TABS: 40.0000 mg | ORAL_TABLET | Freq: Every day | ORAL | Status: DC

## 2015-03-05 MED ORDER — CARVEDILOL 12.5 MG PO TABS: ORAL_TABLET | ORAL | Status: DC

## 2015-03-05 MED ORDER — SPIRONOLACTONE 25 MG PO TABS: 12.5000 mg | ORAL_TABLET | Freq: Every day | ORAL | Status: DC

## 2015-07-10 ENCOUNTER — Encounter (HOSPITAL_COMMUNITY): Payer: Self-pay | Admitting: Internal Medicine

## 2015-07-10 ENCOUNTER — Ambulatory Visit (HOSPITAL_COMMUNITY)
Admission: RE | Admit: 2015-07-10 | Discharge: 2015-07-10 | Disposition: A | Discharge status: Home / Self Care | Payer: BLUE CROSS/BLUE SHIELD | Source: Ambulatory Visit | Attending: Internal Medicine | Admitting: Internal Medicine

## 2015-07-10 VITALS — BP 112/70 | HR 89 | Wt 298.5 lb

## 2015-07-10 DIAGNOSIS — Z79899 Other long term (current) drug therapy: Secondary | ICD-10-CM | POA: Insufficient documentation

## 2015-07-10 DIAGNOSIS — I10 Essential (primary) hypertension: Secondary | ICD-10-CM | POA: Insufficient documentation

## 2015-07-10 DIAGNOSIS — Z87891 Personal history of nicotine dependence: Secondary | ICD-10-CM | POA: Diagnosis not present

## 2015-07-10 DIAGNOSIS — I428 Other cardiomyopathies: Secondary | ICD-10-CM | POA: Diagnosis not present

## 2015-07-10 DIAGNOSIS — R5383 Other fatigue: Secondary | ICD-10-CM | POA: Insufficient documentation

## 2015-07-10 DIAGNOSIS — I5022 Chronic systolic (congestive) heart failure: Secondary | ICD-10-CM | POA: Insufficient documentation

## 2015-07-10 DIAGNOSIS — E119 Type 2 diabetes mellitus without complications: Secondary | ICD-10-CM | POA: Insufficient documentation

## 2015-07-10 DIAGNOSIS — Z7982 Long term (current) use of aspirin: Secondary | ICD-10-CM | POA: Insufficient documentation

## 2015-07-10 DIAGNOSIS — I2510 Atherosclerotic heart disease of native coronary artery without angina pectoris: Secondary | ICD-10-CM | POA: Diagnosis not present

## 2015-07-10 DIAGNOSIS — G4733 Obstructive sleep apnea (adult) (pediatric): Secondary | ICD-10-CM | POA: Insufficient documentation

## 2015-07-10 DIAGNOSIS — R358 Other polyuria: Secondary | ICD-10-CM | POA: Insufficient documentation

## 2015-07-10 LAB — CBC
HCT: 48.6 % (ref 39.0–52.0)
Hemoglobin: 15.5 g/dL (ref 13.0–17.0)
MCH: 28.6 pg (ref 26.0–34.0)
MCHC: 31.9 g/dL (ref 30.0–36.0)
MCV: 89.7 fL (ref 78.0–100.0)
Platelets: 327 10*3/uL (ref 150–400)
RBC: 5.42 MIL/uL (ref 4.22–5.81)
RDW: 14 % (ref 11.5–15.5)
WBC: 9.2 10*3/uL (ref 4.0–10.5)

## 2015-07-10 LAB — COMPREHENSIVE METABOLIC PANEL
ALBUMIN: 3.5 g/dL (ref 3.5–5.0)
ALT: 30 U/L (ref 17–63)
AST: 33 U/L (ref 15–41)
Alkaline Phosphatase: 68 U/L (ref 38–126)
Anion gap: 17 — ABNORMAL HIGH (ref 5–15)
BUN: 9 mg/dL (ref 6–20)
CHLORIDE: 95 mmol/L — AB (ref 101–111)
CO2: 21 mmol/L — AB (ref 22–32)
CREATININE: 0.79 mg/dL (ref 0.61–1.24)
Calcium: 9.3 mg/dL (ref 8.9–10.3)
GFR calc Af Amer: >60 mL/min (ref >60)
GFR calc non Af Amer: >60 mL/min (ref >60)
GLUCOSE: 307 mg/dL — AB (ref 65–99)
Potassium: 4.5 mmol/L (ref 3.5–5.1)
SODIUM: 133 mmol/L — AB (ref 135–145)
Total Bilirubin: 0.9 mg/dL (ref 0.3–1.2)
Total Protein: 6.3 g/dL — ABNORMAL LOW (ref 6.5–8.1)

## 2015-07-10 LAB — TSH: TSH: 1.338 u[IU]/mL (ref 0.350–4.500)

## 2015-07-10 MED ORDER — METFORMIN HCL 500 MG PO TABS: 500.0000 mg | ORAL_TABLET | Freq: Two times a day (BID) | ORAL | Status: DC

## 2015-07-10 NOTE — Patient Instructions (Addendum)
Start Metformin 500 mg Twice daily   Dr Edison Nasuti office will call you to schedule an appointment  Labs today  Your physician has requested that you have an echocardiogram. Echocardiography is a painless test that uses sound waves to create images of your heart. It provides your doctor with information about the size and shape of your heart and how well your heart's chambers and valves are working. This procedure takes approximately one hour. There are no restrictions for this procedure.  We will contact you in 6 months to schedule your next appointment.

## 2015-07-10 NOTE — Progress Notes (Signed)
Advanced Heart Failure Medication Review by a Pharmacist  Does the patient  feel that his/her medications are working for him/her?  yes  Has the patient been experiencing any side effects to the medications prescribed?  no  Does the patient measure his/her own blood pressure or blood glucose at home?  no   Does the patient have any problems obtaining medications due to transportation or finances?   no  Understanding of regimen: good Understanding of indications: good Potential of compliance: good Patient understands to avoid NSAIDs. Patient understands to avoid decongestants.  Issues to address at subsequent visits: Compliance   Pharmacist comments:  Stephen Kane is a pleasant 44 yo M presenting without a medication list but able to verbalize each of his medications including dosages to me. He reports good compliance with most of his medications but states that he forgets to take 1-2 doses of his evening medications. We discussed the importance of consistent use of his medications and he verbalized understanding. He did not have any other medication-related questions or concerns for me at this time.  Ruta Hinds. Velva Harman, PharmD, BCPS, CPP Clinical Pharmacist Pager: [phone] Phone: [phone] 07/10/2015 3:03 PM    Time with patient: 4 minutes Preparation and documentation time: 2 minutes Total time: 6 minutes

## 2015-07-10 NOTE — Progress Notes (Signed)
ADVANCED HF CLINIC NOTE  Patient ID: Stephen Kane, male   DOB: 1970-12-13, 44 y.o.   MRN: [id number]   PCP: None  HF Cardiologist: Armany Mano  HPI: Stephen Kane is a 44 year old male with history of chronic systolic heart failure due to NICM EF 20%, HTN, obesity, former smoker quit 2010, OSA on CPAP.  Admitted to [facility] in November 2014 with new-onset heart failure. ECHO EF 20%. Diuresed and started on HF meds. Discharge weight 295 pounds.   RHC/LHC 07/24/13  RA: 12  RV: 56/18/19  PA: 54/20 (40)  PCWP: 35  Fick CO/CI: 6.6/2.6  PVR: 0.9 Woods  Ao Sat: 89%  PA sats: 61% 64%  Left main: Calcified 20% stenosis  LAD: Gives off 2 diagonals. 30% proximal lesion distal vessel tapers with moderate diffuse plaque. No high grade lesions.  LCX: Nondominant. 30% prox to mid lesion. Small OM-1. Large OM-2. 40% prox OM-2  RCA: Dominant vessel. 40% tubular lesion in midsection. Large RV branch with 50% ostial lesion. . Large PDA.  LV-gram done in the RAO projection: Markedly dilated. Severe global HK Ejection fraction = 20-25%   ECHO 11/20/13 - EF 50-55%    He returns for follow-up for HF.  Feels great. Over the last month he reports increased fatigue.  Denies SOB/PND/Orthopnea/CP. Complains of polyuria/polydypsia. Using CPAP. Taking all medications misses at night 1-2 times week.   Labs 11/09/23/12 K 4.5 Creatinine 0.85 Labs 07/26/13 K 3.4 Creatinine 0.76 SPEP No M spike Labs 07/24/13 Cholesterol 200 TG 132 HDL 42 LDL 132  Labs 07/23/13 TSH 2.2  Pro BNP 3783 Hemoglobin A1C 6 Labs 08/15/13 K 4.9 Creatinine 0.73 Labs 09/07/13 Dig level 0.7  Labs 10/12/2013 K 4.3 Creatinine 0.7  Labs 3/15 K 4.5, creatinine 0.82   ROS: All systems negative except as listed in HPI, PMH and Problem List.  Past Medical History  Diagnosis Date  . Hypertension   . Lumbar disc disease   . Morbidly obese ([place])   . Cardiomyopathy [facility])     Current Outpatient Prescriptions  Medication Sig Dispense Refill  . aspirin 81 MG  EC tablet Take 1 tablet (81 mg total) by mouth daily. 90 tablet 3  . atorvastatin (LIPITOR) 40 MG tablet TAKE ONE TABLET BY MOUTH ONCE DAILY AT 6 PM 90 tablet 3  . carvedilol (COREG) 12.5 MG tablet TAKE ONE AND ONE-HALF TABLETS BY MOUTH TWICE DAILY WITH MEALS 270 tablet 3  . cetirizine (ZYRTEC) 10 MG tablet Take 10 mg by mouth daily.    . furosemide (LASIX) 40 MG tablet Take 1 tablet (40 mg total) by mouth daily. 90 tablet 3  . lisinopril (PRINIVIL,ZESTRIL) 20 MG tablet Take 1 tablet (20 mg total) by mouth 2 (two) times daily. 180 tablet 3  . spironolactone (ALDACTONE) 25 MG tablet Take 0.5 tablets (12.5 mg total) by mouth daily. 45 tablet 3   No current facility-administered medications for this encounter.     PHYSICAL EXAM: Filed Vitals:   07/10/15 1443  BP: 112/70  Pulse: 89  Weight: 298 lb 8 oz (135.399 kg)  SpO2: 98%    General:  Well appearing. No resp difficulty HEENT: normal Neck: supple. JVP difficult to assess due to body habitus but does not appear elevated.   Carotids 2+ bilaterally; no bruits. No lymphadenopathy or thryomegaly appreciated. Cor: PMI normal. Regular rate & rhythm. No rubs, gallops or murmurs. Lungs: clear Abdomen: Obese, soft, nontender, nondistended. No hepatosplenomegaly. No bruits or masses. Good bowel sounds. Extremities: no cyanosis,  clubbing, rash,  edema Neuro: alert & orientedx3, cranial nerves grossly intact. Moves all 4 extremities w/o difficulty. Affect pleasant.  ASSESSMENT & PLAN: - 1. Chronic Systolic heart Failure: nonischemic cardiomyopathy.  Initial Echo 07/2013 EF 20% now recovered to low normal 11/2013 -->50-55%  NYHA I. Volume status stable. Continue lasix 40 mg daily + 12.5 mg spiro daily Continue carvedilol 18.75 mg twice a day Continue lisinopril 20 mg twice a day. - Reinforced daily weights, low salt food choices, and limiting fluid intake to < 2 liters per day.  2. OSA: Continue CPAP nightly.  3. Obesity:  Needs to increase  activity. Encourage to exercise and limit his portions.   4. CAD: LHC with extensive plaque but nonobstructive.  No ischemic symptoms. Continue baby aspirin and atorvastatin.   5. HTN: Stable 6. Fatigue.Check CBC, TSH, CMET.  7. Polyuria: Check Hgb A1C He wants to hold off on ECHO  Follow up in 6 months.  Amy Clegg NP-C  07/10/2015    Patient seen and examined with [name], NP. We discussed all aspects of the encounter. I agree with the assessment and plan as stated above.   Doing very well from HF perspective on good meds. 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 CBG checked in clinic and was 264. Started on metformin 500 bid and arranged to have him establish with Dr. Jilda Panda as PCP. Can likely be discharged from HF clinic at next visit and f/u with [facility].   Sahirah Rudell,MD 4:50 PM

## 2015-07-11 LAB — HEMOGLOBIN A1C
HEMOGLOBIN A1C: 16.4 % — AB (ref 4.8–5.6)
Mean Plasma Glucose: 424 mg/dL

## 2015-08-14 ENCOUNTER — Telehealth (HOSPITAL_COMMUNITY): Payer: Self-pay

## 2015-08-14 MED ORDER — METFORMIN HCL 500 MG PO TABS: 500.0000 mg | ORAL_TABLET | Freq: Two times a day (BID) | ORAL | Status: DC

## 2015-08-14 NOTE — Telephone Encounter (Signed)
RX refill for metformin sent to [facility] in [place]

## 2016-06-12 ENCOUNTER — Telehealth (HOSPITAL_COMMUNITY): Payer: Self-pay | Admitting: Vascular Surgery

## 2016-06-12 NOTE — Telephone Encounter (Signed)
Pt called he needs refills of all medications prescribed by DB , he would like them sent to [place] , he would like to be notified when they are called in .Marland Kitchen He needs them today

## 2016-06-16 ENCOUNTER — Other Ambulatory Visit (HOSPITAL_COMMUNITY): Payer: Self-pay | Admitting: *Deleted

## 2016-06-16 MED ORDER — LISINOPRIL 20 MG PO TABS: 20.0000 mg | ORAL_TABLET | Freq: Two times a day (BID) | ORAL | 3 refills | Status: DC

## 2016-06-16 MED ORDER — CARVEDILOL 12.5 MG PO TABS: ORAL_TABLET | ORAL | 3 refills | Status: DC

## 2016-06-16 MED ORDER — FUROSEMIDE 40 MG PO TABS: 40.0000 mg | ORAL_TABLET | Freq: Every day | ORAL | 3 refills | Status: DC

## 2016-06-16 MED ORDER — SPIRONOLACTONE 25 MG PO TABS
12.5000 mg | ORAL_TABLET | Freq: Every day | ORAL | 3 refills | Status: DC
Start: 2016-06-16 — End: 2016-07-10

## 2016-06-16 MED ORDER — ATORVASTATIN CALCIUM 40 MG PO TABS: ORAL_TABLET | ORAL | 3 refills | Status: DC

## 2016-06-16 MED ORDER — ASPIRIN 81 MG PO TBEC: 81.0000 mg | DELAYED_RELEASE_TABLET | Freq: Every day | ORAL | 3 refills | Status: DC

## 2016-06-16 NOTE — Telephone Encounter (Signed)
I have been out of the office since 9/21 message was sent on 9/22. Will fill today.

## 2016-07-10 ENCOUNTER — Ambulatory Visit (HOSPITAL_BASED_OUTPATIENT_CLINIC_OR_DEPARTMENT_OTHER)
Admission: RE | Admit: 2016-07-10 | Discharge: 2016-07-10 | Disposition: A | Discharge status: Home / Self Care | Payer: 59 | Source: Ambulatory Visit | Attending: Internal Medicine | Admitting: Internal Medicine

## 2016-07-10 ENCOUNTER — Ambulatory Visit (HOSPITAL_COMMUNITY)
Admission: RE | Admit: 2016-07-10 | Discharge: 2016-07-10 | Disposition: A | Discharge status: Home / Self Care | Payer: 59 | Source: Ambulatory Visit | Attending: Internal Medicine | Admitting: Internal Medicine

## 2016-07-10 VITALS — BP 120/92 | HR 90 | Wt 296.0 lb

## 2016-07-10 DIAGNOSIS — I10 Essential (primary) hypertension: Secondary | ICD-10-CM

## 2016-07-10 DIAGNOSIS — I5022 Chronic systolic (congestive) heart failure: Secondary | ICD-10-CM

## 2016-07-10 DIAGNOSIS — I5023 Acute on chronic systolic (congestive) heart failure: Secondary | ICD-10-CM

## 2016-07-10 MED ORDER — ASPIRIN 81 MG PO TBEC: 81.0000 mg | DELAYED_RELEASE_TABLET | Freq: Every day | ORAL | 3 refills | Status: DC

## 2016-07-10 MED ORDER — FUROSEMIDE 40 MG PO TABS: 40.0000 mg | ORAL_TABLET | Freq: Every day | ORAL | 3 refills | Status: DC

## 2016-07-10 MED ORDER — ATORVASTATIN CALCIUM 40 MG PO TABS: ORAL_TABLET | ORAL | 3 refills | Status: AC

## 2016-07-10 MED ORDER — CARVEDILOL 12.5 MG PO TABS: ORAL_TABLET | ORAL | 3 refills | Status: AC

## 2016-07-10 MED ORDER — SILDENAFIL CITRATE 100 MG PO TABS: 100.0000 mg | ORAL_TABLET | Freq: Every day | ORAL | 0 refills | Status: DC | PRN

## 2016-07-10 MED ORDER — SPIRONOLACTONE 25 MG PO TABS: 12.5000 mg | ORAL_TABLET | Freq: Every day | ORAL | 3 refills | Status: AC

## 2016-07-10 MED ORDER — LISINOPRIL 20 MG PO TABS: 20.0000 mg | ORAL_TABLET | Freq: Two times a day (BID) | ORAL | 3 refills | Status: AC

## 2016-07-10 NOTE — Addendum Note (Signed)
Encounter addended by: Kerry Dory, CMA on: 07/10/2016  3:00 PM<BR>    Actions taken: Order Entry activity accessed, Sign clinical note

## 2016-07-10 NOTE — Patient Instructions (Signed)
All prescriptions have been sent to you pharmacy  Your physician recommends that you schedule a follow-up appointment in: 1 year with Dr Haroldine Laws

## 2016-07-10 NOTE — Progress Notes (Signed)
Echocardiogram 2D Echocardiogram has been performed.  Stephen Kane 07/10/2016, 1:43 PM

## 2016-07-10 NOTE — Progress Notes (Signed)
ADVANCED HF CLINIC NOTE  Patient ID: Stephen Kane, male   DOB: 07-03-1971, 45 y.o.   MRN: [id number]   PCP: None  HF Cardiologist: Kamarion Zagami  HPI: Stephen Kane is a 45 year old male with history of chronic systolic heart failure due to NICM EF 20%, HTN, DM, obesity, former smoker quit 2010, OSA on CPAP.  Admitted to [facility] in November 2014 with new-onset heart failure. ECHO EF 20%. Diuresed and started on HF meds. Discharge weight 295 pounds.   RHC/LHC 07/24/13  RA: 12  RV: 56/18/19  PA: 54/20 (40)  PCWP: 35  Fick CO/CI: 6.6/2.6  PVR: 0.9 [place]  Ao Sat: 89%  PA sats: 61% 64%  Left main: Calcified 20% stenosis  LAD: Gives off 2 diagonals. 30% proximal lesion distal vessel tapers with moderate diffuse plaque. No high grade lesions.  LCX: Nondominant. 30% prox to mid lesion. Small OM-1. Large OM-2. 40% prox OM-2  RCA: Dominant vessel. 40% tubular lesion in midsection. Large RV branch with 50% ostial lesion. . Large PDA.  LV-gram done in the RAO projection: Markedly dilated. Severe global HK Ejection fraction = 20-25%   ECHO 11/20/13 - EF 50-55%   ECHO 07/10/16 reviewed personally EF 55% Normal RV  He returns for follow-up for HF.  Feels great. We saw him a year ago with polyuria. Glucose 424 with HgbA1c 16.4. We started metformin and sent to PCP but he has gone a year without seeing PCP.  Feels great. Walking everyday lost 25 pounds.  Denies SOB/PND/Orthopnea/CP.Using CPAP. Taking all medications.  Labs 11/09/23/12 K 4.5 Creatinine 0.85 Labs 07/26/13 K 3.4 Creatinine 0.76 SPEP No M spike Labs 07/24/13 Cholesterol 200 TG 132 HDL 42 LDL 132  Labs 07/23/13 TSH 2.2  Pro BNP 3783 Hemoglobin A1C 6 Labs 08/15/13 K 4.9 Creatinine 0.73 Labs 09/07/13 Dig level 0.7  Labs 10/12/2013 K 4.3 Creatinine 0.7  Labs 3/15 K 4.5, creatinine 0.82   ROS: All systems negative except as listed in HPI, PMH and Problem List.  Past Medical History:  Diagnosis Date  . Cardiomyopathy ([employer])   . Hypertension   .  Lumbar disc disease   . Morbidly obese ([place])     Current Outpatient Prescriptions  Medication Sig Dispense Refill  . aspirin 81 MG EC tablet Take 1 tablet (81 mg total) by mouth daily. 90 tablet 3  . atorvastatin (LIPITOR) 40 MG tablet TAKE ONE TABLET BY MOUTH ONCE DAILY AT 6 PM 90 tablet 3  . carvedilol (COREG) 12.5 MG tablet TAKE ONE AND ONE-HALF TABLETS BY MOUTH TWICE DAILY WITH MEALS 270 tablet 3  . cetirizine (ZYRTEC) 10 MG tablet Take 10 mg by mouth daily as needed for allergies.     . Chlorpheniramine-DM (CORICIDIN COUGH/COLD) 4-30 MG TABS Take 1 tablet by mouth daily as needed (cough/cold).    . furosemide (LASIX) 40 MG tablet Take 1 tablet (40 mg total) by mouth daily. 90 tablet 3  . lisinopril (PRINIVIL,ZESTRIL) 20 MG tablet Take 1 tablet (20 mg total) by mouth 2 (two) times daily. 180 tablet 3  . metFORMIN (GLUCOPHAGE) 500 MG tablet Take 1 tablet (500 mg total) by mouth 2 (two) times daily with a meal. 60 tablet 3  . spironolactone (ALDACTONE) 25 MG tablet Take 0.5 tablets (12.5 mg total) by mouth daily. 45 tablet 3   No current facility-administered medications for this encounter.    Wt Readings from Last 3 Encounters:  07/10/16 296 lb (134.3 kg)  07/10/15 298 lb 8 oz (135.4 kg)  12/17/14 (!) 320 lb 8 oz (145.4 kg)     PHYSICAL EXAM: Vitals:   07/10/16 1408  BP: (!) 120/92  BP Location: Left Arm  Patient Position: Sitting  Cuff Size: Large  Pulse: 90  SpO2: 99%  Weight: 296 lb (134.3 kg)    General:  Well appearing. No resp difficulty HEENT: normal Neck: supple. JVP difficult to assess due to body habitus but does not appear elevated.   Carotids 2+ bilaterally; no bruits. No lymphadenopathy or thryomegaly appreciated. Cor: PMI normal. Regular rate & rhythm. No rubs, gallops or murmurs. Lungs: clear Abdomen: Obese, soft, nontender, nondistended. No hepatosplenomegaly. No bruits or masses. Good bowel sounds. Extremities: no cyanosis, clubbing, rash,  edema Neuro:  alert & orientedx3, cranial nerves grossly intact. Moves all 4 extremities w/o difficulty. Affect pleasant.  ASSESSMENT & PLAN: - 1. Chronic Systolic heart Failure: nonischemic cardiomyopathy.  Initial Echo 07/2013 EF 20% --Echo today reviewed personally EF 55%  --NYHA I. Volume status stable. Continue lasix 40 mg daily + 12.5 mg spiro daily --Continue carvedilol 18.75 mg twice a day --Continue lisinopril 20 mg twice a day. - Reinforced daily weights, low salt food choices, and limiting fluid intake to < 2 liters per day.  2. OSA: Continue CPAP nightly.  3. Obesity:  Continue to exercise and limit his portions.   4. CAD: LHC with extensive plaque but nonobstructive.  No ischemic symptoms. Continue baby aspirin and atorvastatin.   5. HTN: Well controlled 6. DM2  --Continue metformin, statin and ASA --F/u PCP in 2 weeks  Jamoni Broadfoot,MD 2:46 PM

## 2016-07-15 ENCOUNTER — Telehealth (HOSPITAL_COMMUNITY): Payer: Self-pay | Admitting: Pharmacist

## 2016-07-15 MED ORDER — TADALAFIL 10 MG PO TABS: 10.0000 mg | ORAL_TABLET | Freq: Every day | ORAL | 0 refills | Status: DC | PRN

## 2016-07-15 NOTE — Telephone Encounter (Signed)
Insurance prefers Cialis to Viagra so will send in Rx for 10 mg PRN.   Ruta Hinds. Velva Harman, PharmD, BCPS, CPP Clinical Pharmacist Pager: [phone] Phone: [phone] 07/15/2016 4:00 PM

## 2016-07-27 ENCOUNTER — Other Ambulatory Visit (HOSPITAL_COMMUNITY): Payer: Self-pay | Admitting: Internal Medicine

## 2018-06-28 ENCOUNTER — Ambulatory Visit
Admission: RE | Admit: 2018-06-28 | Discharge: 2018-06-28 | Disposition: A | Discharge status: Home / Self Care | Payer: 59 | Source: Ambulatory Visit | Attending: Internal Medicine | Admitting: Internal Medicine

## 2018-06-28 ENCOUNTER — Other Ambulatory Visit: Payer: Self-pay | Admitting: Internal Medicine

## 2018-06-28 DIAGNOSIS — M542 Cervicalgia: Secondary | ICD-10-CM

## 2018-07-12 ENCOUNTER — Ambulatory Visit: Payer: 59 | Admitting: Physical Therapy

## 2018-07-13 ENCOUNTER — Ambulatory Visit: Payer: 59 | Attending: Internal Medicine | Admitting: Physical Therapy

## 2018-07-13 ENCOUNTER — Encounter: Payer: Self-pay | Admitting: Physical Therapy

## 2018-07-13 ENCOUNTER — Other Ambulatory Visit: Payer: Self-pay

## 2018-07-13 DIAGNOSIS — M542 Cervicalgia: Secondary | ICD-10-CM | POA: Insufficient documentation

## 2018-07-13 DIAGNOSIS — R293 Abnormal posture: Secondary | ICD-10-CM | POA: Diagnosis present

## 2018-07-13 NOTE — Therapy (Signed)
[place], [place], [address] Phone: [phone]   Fax:  [phone]  Physical Therapy Evaluation  Patient Details  Name: Stephen Kane MRN: [id number] Date of Birth: 21-Mar-1971 Referring Provider (PT): Dr Jilda Panda   Encounter Date: 07/13/2018  PT End of Session - 07/13/18 0846    Visit Number  1    Number of Visits  4    Date for PT Re-Evaluation  08/10/18    PT Start Time  0846    PT Stop Time  0918    PT Time Calculation (min)  32 min       Past Medical History:  Diagnosis Date  . Cardiomyopathy ([place])   . Hypertension   . Lumbar disc disease   . Morbidly obese [facility])     Past Surgical History:  Procedure Laterality Date  . LEFT AND RIGHT HEART CATHETERIZATION WITH CORONARY ANGIOGRAM N/A 07/24/2013   Procedure: LEFT AND RIGHT HEART CATHETERIZATION WITH CORONARY ANGIOGRAM;  Surgeon: Jolaine Artist, MD;  Location: [facility] C[facility] LAB;  Service: Cardiovascular;  Laterality: N/A;  . LUMBAR LAMINECTOMY      There were no vitals filed for this visit.   Subjective Assessment - 07/13/18 0846    Subjective  Pt reports he developed neck pain 2 months ago, he has an old Lt shoulder injury 8 yrs ago working out on a [employer].  The pain goes up into the neck from the shoulder.     Pertinent History  HNP/lumbar fusion yrs ago    Diagnostic tests  xrays DDD cervical     Currently in Pain?  Yes    Pain Score  1    goes up to 5/10 at times   Pain Location  Shoulder    Pain Orientation  Left    Pain Descriptors / Indicators  Dull    Pain Type  Acute pain    Pain Onset  1 to 4 weeks ago    Pain Frequency  Intermittent    Aggravating Factors   no pattern, random    Pain Relieving Factors  ibuprofen         [facility] PT Assessment - 07/13/18 0001      Assessment   Medical Diagnosis  cervical DDD    Referring Provider (PT)  Dr Jilda Panda    Onset Date/Surgical Date  06/22/18    Hand Dominance   Right    Next MD Visit  09/30/2018    Prior Therapy  not for neck      Precautions   Precautions  None      Balance Screen   Has the patient fallen in the past 6 months  No      Prior Function   Level of Independence  Independent    Vocation  Full time employment    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    Leisure  walks everyday,       Observation/Other Assessments   Focus on Therapeutic Outcomes (FOTO)   28% limited      Posture/Postural Control   Posture/Postural Control  Postural limitations    Postural Limitations  Forward head;Rounded Shoulders;Increased thoracic kyphosis   obesity     ROM / Strength   AROM / PROM / Strength  AROM;Strength      AROM   AROM Assessment Site  Cervical;Shoulder    Right/Left Shoulder  --   WNL   Cervical Flexion  to chest  Cervical Extension  53    Cervical - Right Side Bend  42    Cervical - Left Side Bend  35    Cervical - Right Rotation  75    Cervical - Left Rotation  73      Strength   Strength Assessment Site  Shoulder;Elbow    Right/Left Shoulder  --   WNL   Right/Left Elbow  --   WNL     Special Tests   Other special tests  (-) spurlings        Performed HEP per handouts , VC for form.         Objective measurements completed on examination: See above findings.              PT Education - 07/13/18 0839    Education Details  HEP    Person(s) Educated  Patient    Methods  Explanation;Demonstration;Handout    Comprehension  Returned demonstration;Verbalized understanding          PT Long Term Goals - 07/13/18 0837      PT LONG TERM GOAL #1   Title  I with advanced HEP ( 08/10/18)     Time  4    Period  Weeks    Status  New      PT LONG TERM GOAL #2   Title  improve FOTO =/< 20% limited ( 08/10/18)     Time  4    Period  Weeks    Status  New      PT LONG TERM GOAL #3   Title  report =/> 75% reduction of pain episodes in his neck  ( 08/10/18)     Time  4     Period  Weeks    Status  New             Plan - 07/13/18 0920    Clinical Impression Statement  47 yo male with ~ 2 wk onset of Lt sided neck pain that is intermittent in nature and occassionally goes up to 5/10.  Overall cervical and UE ROM is WNL, UE strength is WNL however there is some weakness in his upper back.  He has a lot of foward posturing of the upper body with increased kyphosis placing stress on his neck and the cervical musculature.  He would benefit from PT to instruct in HEP of chest openers, thoracic mobility and upper back strengthening.     History and Personal Factors relevant to plan of care:  lumbar fusion, old Lt shoulder injury    Clinical Presentation  Stable    Clinical Decision Making  Low    Rehab Potential  Excellent    PT Frequency  1x / week    PT Duration  4 weeks    PT Treatment/Interventions  Iontophoresis 4mg /ml Dexamethasone;Dry needling;Joint Manipulations;Manual techniques;Moist Heat;Taping;Patient/family education;Therapeutic exercise;Cryotherapy;Electrical Stimulation    PT Next Visit Plan  thoracic and chest openers, upper back strengthening. possible thoracic mobs    Consulted and Agree with Plan of Care  Patient       Patient will benefit from skilled therapeutic intervention in order to improve the following deficits and impairments:  Pain, Postural dysfunction, Impaired UE functional use  Visit Diagnosis: Cervicalgia - Plan: PT plan of care cert/re-cert  Abnormal posture - Plan: PT plan of care cert/re-cert     Problem List Patient Active Problem List   Diagnosis Date Noted  . Diabetes mellitus ([place]) 07/10/2015  . CAD (coronary artery  disease) 02/01/2014  . Hypertension 02/01/2014  . Chronic systolic heart failure ([place]) 08/03/2013  . OSA on CPAP 08/03/2013  . Acute systolic heart failure ([place]) 07/24/2013  . Acute on chronic systolic congestive heart failure ([place]) 07/23/2013  . Respiratory distress 07/23/2013  . Hypertensive  heart disease 07/23/2013  . Congestive dilated cardiomyopathy ([place]) 07/23/2013  . Morbid obesity ([place]) 07/23/2013  . Flank pain 07/23/2013  . Lumbar disc disease     Jeral Pinch PT  07/13/2018, 9:26 AM  [facility] [address] [place], [place], [address] Phone: [phone]   Fax:  [phone]  Name: Stephen Kane MRN: [id number] Date of Birth: 12-Nov-1970

## 2018-07-18 ENCOUNTER — Encounter: Payer: 59 | Admitting: Physical Therapy

## 2018-07-22 ENCOUNTER — Encounter: Payer: Self-pay | Admitting: Physical Therapy

## 2018-07-22 ENCOUNTER — Ambulatory Visit: Payer: 59 | Attending: Internal Medicine | Admitting: Physical Therapy

## 2018-07-22 DIAGNOSIS — R293 Abnormal posture: Secondary | ICD-10-CM | POA: Insufficient documentation

## 2018-07-22 DIAGNOSIS — M542 Cervicalgia: Secondary | ICD-10-CM

## 2018-07-22 NOTE — Patient Instructions (Signed)
EXTENSION: Standing - Resistance Band: Stable (Active)   Stand, right arm at side. Against yellow resistance band, draw arm backward, as far as possible, keeping elbow straight. Complete __2_ sets of __10_ repetitions. Perform _2__ sessions per day.  Copyright  VHI. All rights reserved.  Resistive Band Rowing   With resistive band anchored in door, grasp both ends. Keeping elbows bent, pull back, squeezing shoulder blades together. Hold __5__ seconds. Repeat _10-20___ times. Do __2__ sessions per day.  CAN PERFORM THE EXERCISES BELOW IN STANDING!  Over Head Pull: Narrow Grip       On back, knees bent, feet flat, band across thighs, elbows straight but relaxed. Pull hands apart (start). Keeping elbows straight, bring arms up and over head, hands toward floor. Keep pull steady on band. Hold momentarily. Return slowly, keeping pull steady, back to start. Repeat _10-20__ times. Band color ___G__   Side Pull: Double Arm   On back, knees bent, feet flat. Arms perpendicular to body, shoulder level, elbows straight but relaxed. Pull arms out to sides, elbows straight. Resistance band comes across collarbones, hands toward floor. Hold momentarily. Slowly return to starting position. Repeat __10-20_ times. Band color _G____     Shoulder Rotation: Double Arm   On back, knees bent, feet flat, elbows tucked at sides, bent 90, hands palms up. Pull hands apart and down toward floor, keeping elbows near sides. Hold momentarily. Slowly return to starting position. Repeat _10-20 __ times. Band color ___G___

## 2018-07-22 NOTE — Therapy (Signed)
[place], [place], [address] Phone: [phone]   Fax:  [phone]  Physical Therapy Treatment  Patient Details  Name: Stephen Kane MRN: [id number] Date of Birth: 1971-03-21 Referring Provider (PT): Dr Jilda Panda   Encounter Date: 07/22/2018  PT End of Session - 07/22/18 1146    Visit Number  2    Number of Visits  4    Date for PT Re-Evaluation  08/10/18    PT Start Time  4403    PT Stop Time  1210    PT Time Calculation (min)  26 min       Past Medical History:  Diagnosis Date  . Cardiomyopathy ([place])   . Hypertension   . Lumbar disc disease   . Morbidly obese [facility])     Past Surgical History:  Procedure Laterality Date  . LEFT AND RIGHT HEART CATHETERIZATION WITH CORONARY ANGIOGRAM N/A 07/24/2013   Procedure: LEFT AND RIGHT HEART CATHETERIZATION WITH CORONARY ANGIOGRAM;  Surgeon: Jolaine Artist, MD;  Location: [facility] CATH LAB;  Service: Cardiovascular;  Laterality: N/A;  . LUMBAR LAMINECTOMY      There were no vitals filed for this visit.  Subjective Assessment - 07/22/18 1146    Subjective  Feeling better. Not having the pain as constant.     Currently in Pain?  No/denies                       [facility] Adult PT Treatment/Exercise - 07/22/18 0001      Exercises   Exercises  Neck      Neck Exercises: Machines for Strengthening   UBE (Upper Arm Bike)  Retro x 4 min       Neck Exercises: Theraband   Shoulder Extension  15 reps;Green    Rows  Green;15 reps    Shoulder External Rotation  15 reps    Shoulder External Rotation Limitations  green    Horizontal ABduction  10 reps   green   Other Theraband Exercises  pullovers with green band x 10     Other Theraband Exercises  open books with green band x 15 each       Neck Exercises: Supine   Neck Retraction  10 secs      Neck Exercises: Stretches   Corner Stretch  3 reps;30 seconds    Other Neck Stretches   thoracic extension over half foam roller- added UE flexion              PT Education - 07/22/18 1200    Education Details  HEP    Person(s) Educated  Patient    Methods  Explanation;Handout    Comprehension  Verbalized understanding          PT Long Term Goals - 07/13/18 0837      PT LONG TERM GOAL #1   Title  I with advanced HEP ( 08/10/18)     Time  4    Period  Weeks    Status  New      PT LONG TERM GOAL #2   Title  improve FOTO =/< 20% limited ( 08/10/18)     Time  4    Period  Weeks    Status  New      PT LONG TERM GOAL #3   Title  report =/> 75% reduction of pain episodes in his neck  ( 08/10/18)     Time  4  Period  Weeks    Status  New            Plan - 07/22/18 1147    Clinical Impression Statement  Pain reports reduced pain. He has some intermittent aching but no longer constant and not everyday. Progressed HEP with green theraband scapular stabilization. No increased pain.      PT Next Visit Plan  thoracic and chest openers, upper back strengthening. possible thoracic mobs    PT Home Exercise Plan  open books, scap retract, chin tucks, thoracic extension over a pillow, scap stab series in standing with green band      Consulted and Agree with Plan of Care  Patient       Patient will benefit from skilled therapeutic intervention in order to improve the following deficits and impairments:  Pain, Postural dysfunction, Impaired UE functional use  Visit Diagnosis: Cervicalgia  Abnormal posture     Problem List Patient Active Problem List   Diagnosis Date Noted  . Diabetes mellitus ([place]) 07/10/2015  . CAD (coronary artery disease) 02/01/2014  . Hypertension 02/01/2014  . Chronic systolic heart failure ([place]) 08/03/2013  . OSA on CPAP 08/03/2013  . Acute systolic heart failure ([place]) 07/24/2013  . Acute on chronic systolic congestive heart failure ([place]) 07/23/2013  . Respiratory distress 07/23/2013  . Hypertensive heart disease  07/23/2013  . Congestive dilated cardiomyopathy ([place]) 07/23/2013  . Morbid obesity ([place]) 07/23/2013  . Flank pain 07/23/2013  . Lumbar disc disease     Dorene Ar, PTA 07/22/2018, 12:23 PM  [place] [facility] [address] [place], [place], [address] Phone: [phone]   Fax:  [phone]  Name: Stephen Kane MRN: [id number] Date of Birth: Aug 07, 1971

## 2018-07-25 ENCOUNTER — Encounter: Payer: 59 | Admitting: Physical Therapy

## 2018-07-29 ENCOUNTER — Ambulatory Visit: Payer: 59 | Admitting: Physical Therapy

## 2018-07-29 ENCOUNTER — Encounter

## 2018-07-29 DIAGNOSIS — M542 Cervicalgia: Secondary | ICD-10-CM

## 2018-07-29 DIAGNOSIS — R293 Abnormal posture: Secondary | ICD-10-CM

## 2018-07-29 NOTE — Therapy (Signed)
[place], [place], [address] Phone: [phone]   Fax:  [phone]  Physical Therapy Treatment  Patient Details  Name: Stephen Kane MRN: [id number] Date of Birth: 12-06-1970 Referring Provider (PT): Dr Jilda Panda   Encounter Date: 07/29/2018  PT End of Session - 07/29/18 0851    Visit Number  3    Number of Visits  4    Date for PT Re-Evaluation  08/10/18    PT Start Time  0845    PT Stop Time  0923    PT Time Calculation (min)  38 min    Activity Tolerance  Patient tolerated treatment well    Behavior During Therapy  [facility] for tasks assessed/performed       Past Medical History:  Diagnosis Date  . Cardiomyopathy ([place])   . Hypertension   . Lumbar disc disease   . Morbidly obese [facility])     Past Surgical History:  Procedure Laterality Date  . LEFT AND RIGHT HEART CATHETERIZATION WITH CORONARY ANGIOGRAM N/A 07/24/2013   Procedure: LEFT AND RIGHT HEART CATHETERIZATION WITH CORONARY ANGIOGRAM;  Surgeon: Jolaine Artist, MD;  Location: [facility] CATH LAB;  Service: Cardiovascular;  Laterality: N/A;  . LUMBAR LAMINECTOMY      There were no vitals filed for this visit.  Subjective Assessment - 07/29/18 0850    Subjective  Doing great, no pain or difficulty with anything, only occasional pains    Currently in Pain?  No/denies                       [facility] Adult PT Treatment/Exercise - 07/29/18 0001      Exercises   Exercises  Neck      Neck Exercises: Machines for Strengthening   UBE (Upper Arm Bike)  Retro x 6 min L3      Neck Exercises: Theraband   Shoulder Extension  20 reps;Green    Rows  20 reps;Green    Shoulder External Rotation  20 reps;Green    Horizontal ABduction  20 reps;Green   pt supine   Other Theraband Exercises  pullovers with green band x 15    Other Theraband Exercises  open books with green band x 15 each       Neck Exercises: Supine   Neck Retraction  20  reps      Neck Exercises: Stretches   Corner Stretch  3 reps;30 seconds    Other Neck Stretches  thoracic extension over half foam roller- added UE flexion 5 sec X20    Other Neck Stretches  thoraic rotation open book stretch in sidelying X 10 ea side                  PT Long Term Goals - 07/13/18 0837      PT LONG TERM GOAL #1   Title  I with advanced HEP ( 08/10/18)     Time  4    Period  Weeks    Status  New      PT LONG TERM GOAL #2   Title  improve FOTO =/< 20% limited ( 08/10/18)     Time  4    Period  Weeks    Status  New      PT LONG TERM GOAL #3   Title  report =/> 75% reduction of pain episodes in his neck  ( 08/10/18)     Time  4    Period  Weeks    Status  New            Plan - 07/29/18 4401    Clinical Impression Statement  Pt is making excellent progress with PT thus far and had no pain or complaints today with therex progression. He has one more visit on POC and may be ready for D/C if he is still doing great.     Rehab Potential  Excellent    PT Frequency  1x / week    PT Duration  4 weeks    PT Treatment/Interventions  Iontophoresis 4mg /ml Dexamethasone;Dry needling;Joint Manipulations;Manual techniques;Moist Heat;Taping;Patient/family education;Therapeutic exercise;Cryotherapy;Electrical Stimulation    PT Next Visit Plan  thoracic and chest openers, upper back strengthening. possible thoracic mobs    PT Home Exercise Plan  open books, scap retract, chin tucks, thoracic extension over a pillow, scap stab series in standing with green band      Consulted and Agree with Plan of Care  Patient       Patient will benefit from skilled therapeutic intervention in order to improve the following deficits and impairments:  Pain, Postural dysfunction, Impaired UE functional use  Visit Diagnosis: Cervicalgia  Abnormal posture     Problem List Patient Active Problem List   Diagnosis Date Noted  . Diabetes mellitus ([place]) 07/10/2015  . CAD  (coronary artery disease) 02/01/2014  . Hypertension 02/01/2014  . Chronic systolic heart failure ([place]) 08/03/2013  . OSA on CPAP 08/03/2013  . Acute systolic heart failure ([place]) 07/24/2013  . Acute on chronic systolic congestive heart failure ([place]) 07/23/2013  . Respiratory distress 07/23/2013  . Hypertensive heart disease 07/23/2013  . Congestive dilated cardiomyopathy ([place]) 07/23/2013  . Morbid obesity ([place]) 07/23/2013  . Flank pain 07/23/2013  . Lumbar disc disease     Debbe Odea, PT,DPT 07/29/2018, 9:26 AM  [facility] [address] [place], [place], [address] Phone: [phone]   Fax:  [phone]  Name: Stephen Kane MRN: [id number] Date of Birth: 01/08/1971

## 2018-08-01 ENCOUNTER — Encounter: Payer: 59 | Admitting: Physical Therapy

## 2018-08-05 ENCOUNTER — Ambulatory Visit: Payer: 59 | Admitting: Physical Therapy

## 2018-08-05 ENCOUNTER — Encounter: Payer: Self-pay | Admitting: Physical Therapy

## 2018-08-05 DIAGNOSIS — R293 Abnormal posture: Secondary | ICD-10-CM

## 2018-08-05 DIAGNOSIS — M542 Cervicalgia: Secondary | ICD-10-CM | POA: Diagnosis not present

## 2018-08-05 NOTE — Therapy (Signed)
[place], [place], [address] Phone: [phone]   Fax:  [phone]  Physical Therapy Treatment/Discharge  Patient Details  Name: Stephen Kane MRN: [id number] Date of Birth: 13-May-1971 Referring Provider (PT): Dr Jilda Panda   Encounter Date: 08/05/2018  PT End of Session - 08/05/18 0838    Visit Number  4    Number of Visits  4    Date for PT Re-Evaluation  08/10/18    PT Start Time  0801    Activity Tolerance  Patient tolerated treatment well    Behavior During Therapy  [facility] for tasks assessed/performed       Past Medical History:  Diagnosis Date  . Cardiomyopathy (DISH)   . Hypertension   . Lumbar disc disease   . Morbidly obese [facility]     Past Surgical History:  Procedure Laterality Date  . LEFT AND RIGHT HEART CATHETERIZATION WITH CORONARY ANGIOGRAM N/A 07/24/2013   Procedure: LEFT AND RIGHT HEART CATHETERIZATION WITH CORONARY ANGIOGRAM;  Surgeon: Jolaine Artist, MD;  Location: [facility] CATH LAB;  Service: Cardiovascular;  Laterality: N/A;  . LUMBAR LAMINECTOMY      There were no vitals filed for this visit.  Subjective Assessment - 08/05/18 0803    Subjective  Overall pt. doing well. Some mild pain into left upper trapezius region but otherwise symptoms improved and pt. states feels ready for d/c formal therapy. Plan will be to continue via HEP and follow up with MD if having any changes in status.    Pertinent History  HNP/lumbar fusion yrs ago    Diagnostic tests  xrays DDD cervical     Currently in Pain?  Yes    Pain Score  2     Pain Location  Neck    Pain Orientation  Left    Pain Descriptors / Indicators  Aching    Pain Radiating Towards  Left upper trapezius    Pain Onset  More than a month ago    Pain Frequency  Intermittent    Aggravating Factors   no specific aggs/eases    Pain Relieving Factors  ibuprofen         [facility] PT Assessment - 08/05/18 0001      AROM   Cervical Flexion  62    Cervical Extension  24    Cervical - Right Side Bend  42    Cervical - Left Side Bend  38    Cervical - Right Rotation  70    Cervical - Left Rotation  70                   [facility] Adult PT Treatment/Exercise - 08/05/18 0001      Neck Exercises: Machines for Strengthening   UBE (Upper Arm Bike)  Reverse L1x5 min      Neck Exercises: Theraband   Shoulder Extension  20 reps;Green    Rows  20 reps;Green    Shoulder External Rotation  20 reps;Green    Horizontal ABduction  20 reps;Green   supine   Other Theraband Exercises  pullovers x 20 with green band      Neck Exercises: Supine   Neck Retraction  20 reps   seated today     Manual Therapy   Manual Therapy  Soft tissue mobilization    Soft tissue mobilization  Left upper trapezius and levator in sitting      Neck Exercises: Stretches   Upper Trapezius Stretch  Left;3  reps;30 seconds    Corner Stretch  3 reps;30 seconds    Other Neck Stretches  supine pec minor stretch 3x30 sec             PT Education - 08/05/18 0837    Education Details  HEP, POC    Person(s) Educated  Patient    Methods  Explanation;Demonstration    Comprehension  Verbalized understanding          PT Long Term Goals - 08/05/18 0829      PT LONG TERM GOAL #1   Title  I with advanced HEP ( 08/10/18)     Time  4    Period  Weeks    Status  Achieved      PT LONG TERM GOAL #2   Title  improve FOTO =/< 20% limited ( 08/10/18)     Baseline  4% today    Time  4    Period  Weeks    Status  Achieved      PT LONG TERM GOAL #3   Title  report =/> 75% reduction of pain episodes in his neck  ( 08/10/18)     Time  4    Period  Weeks    Status  Achieved            Plan - 08/05/18 8299    Clinical Impression Statement  Pt. has progressed well with therapy with symptom improvement. No significant functional limitations at this point so plan d/c to HEP. All therapy goals met.    Rehab Potential   Excellent    PT Frequency  1x / week    PT Duration  4 weeks   d/c  to HEP today   PT Treatment/Interventions  Iontophoresis '4mg'$ /ml Dexamethasone;Dry needling;Joint Manipulations;Manual techniques;Moist Heat;Taping;Patient/family education;Therapeutic exercise;Cryotherapy;Electrical Stimulation    PT Next Visit Plan  thoracic and chest openers, upper back strengthening. possible thoracic mobs    PT Home Exercise Plan  open books, scap retract, chin tucks, thoracic extension over a pillow, scap stab series in standing with green band      Consulted and Agree with Plan of Care  Patient       Patient will benefit from skilled therapeutic intervention in order to improve the following deficits and impairments:  Pain, Postural dysfunction, Impaired UE functional use  Visit Diagnosis: Cervicalgia  Abnormal posture     Problem List Patient Active Problem List   Diagnosis Date Noted  . Diabetes mellitus ([place]) 07/10/2015  . CAD (coronary artery disease) 02/01/2014  . Hypertension 02/01/2014  . Chronic systolic heart failure ([place]) 08/03/2013  . OSA on CPAP 08/03/2013  . Acute systolic heart failure ([place]) 07/24/2013  . Acute on chronic systolic congestive heart failure ([place]) 07/23/2013  . Respiratory distress 07/23/2013  . Hypertensive heart disease 07/23/2013  . Congestive dilated cardiomyopathy ([place]) 07/23/2013  . Morbid obesity ([place]) 07/23/2013  . Flank pain 07/23/2013  . Lumbar disc disease         PHYSICAL THERAPY DISCHARGE SUMMARY  Visits from Start of Care: 4 Current functional level related to goals / functional outcomes: Goals met, no significant current limitations   Remaining deficits: none   Education / Equipment: HEP Plan: Patient agrees to discharge.  Patient goals were met. Patient is being discharged due to meeting the stated rehab goals.  ?????             Beaulah Dinning, PT, DPT 08/05/18 8:41 AM  [facility]  [place], [place], [address] Phone: [phone]   Fax:  [phone]  Name: Stephen Kane MRN: [id number] Date of Birth: [birth year]

## 2018-08-08 ENCOUNTER — Encounter: Payer: 59 | Admitting: Physical Therapy

## 2018-08-12 ENCOUNTER — Ambulatory Visit: Payer: 59 | Admitting: Physical Therapy

## 2018-08-15 ENCOUNTER — Encounter: Payer: 59 | Admitting: Physical Therapy

## 2018-08-26 ENCOUNTER — Encounter: Payer: 59 | Admitting: Physical Therapy

## 2020-05-24 IMAGING — CR DG CERVICAL SPINE COMPLETE 4+V
7 series · 7 of 7 positions shown · non-contrast
Comparison: 07/22/2009

CLINICAL DATA: Left-sided neck pain for 1 week

EXAM:
CERVICAL SPINE - COMPLETE 4+ VIEW

[w cervical spine lat]
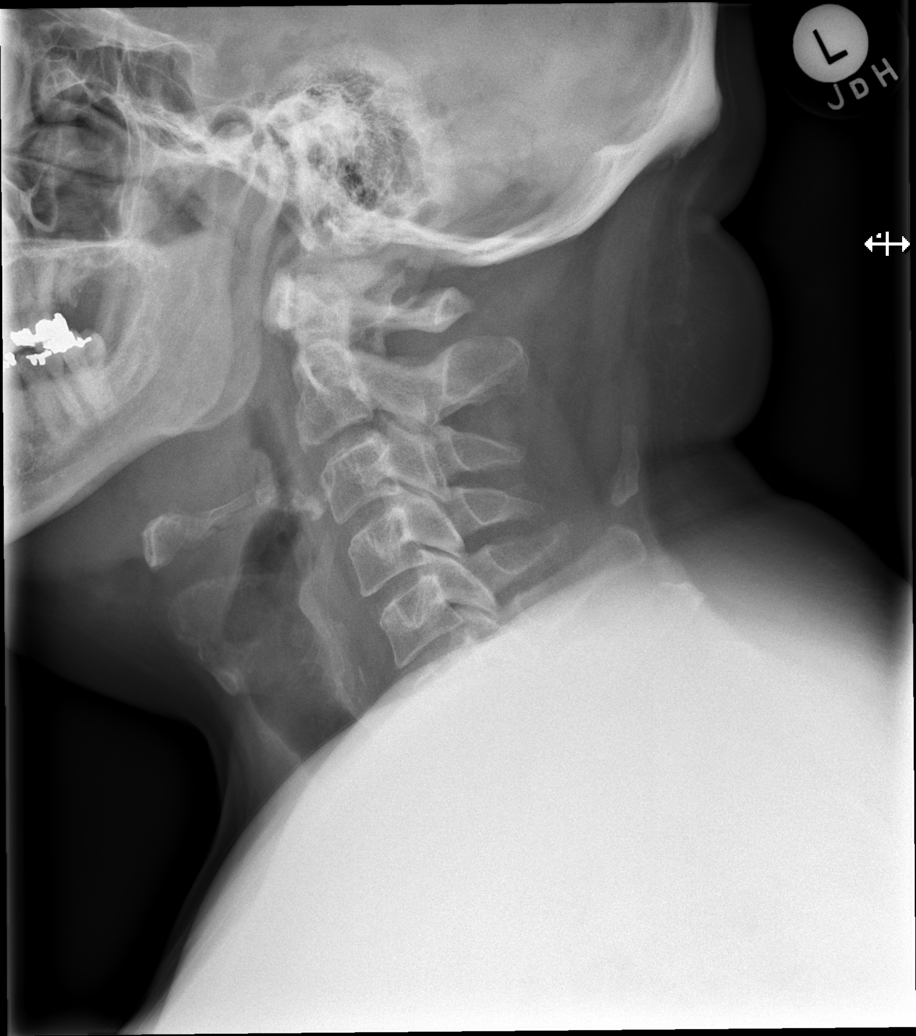

[w cervical swimmers]
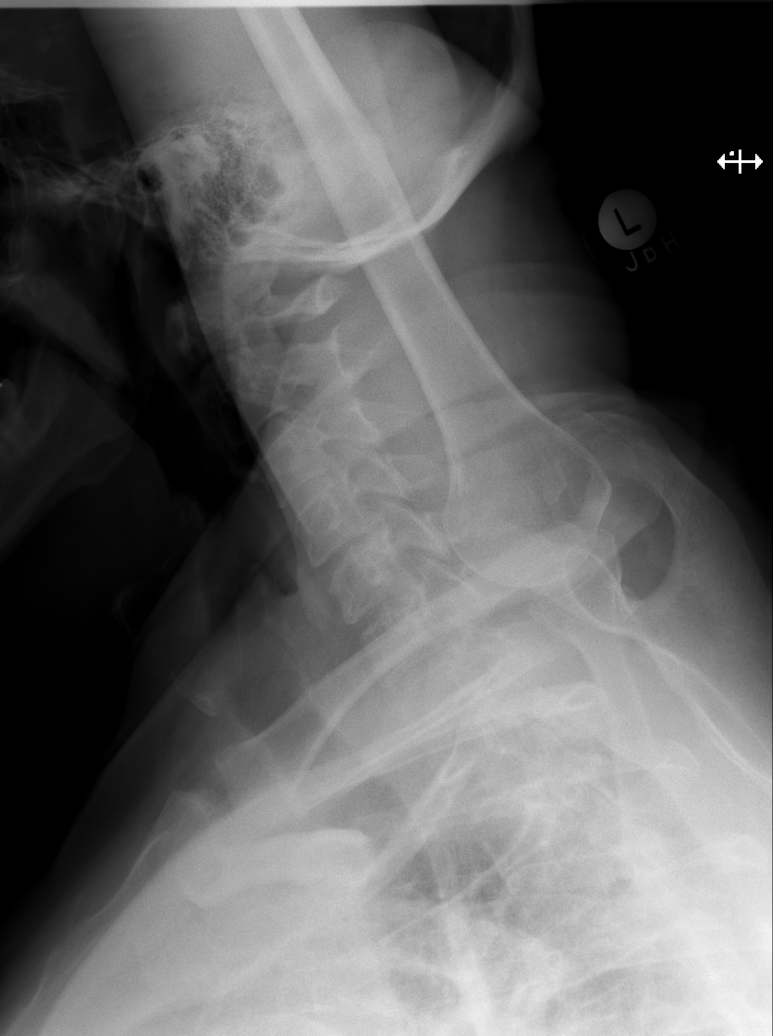

[w cervical spine ap_obl (1 of 2)]
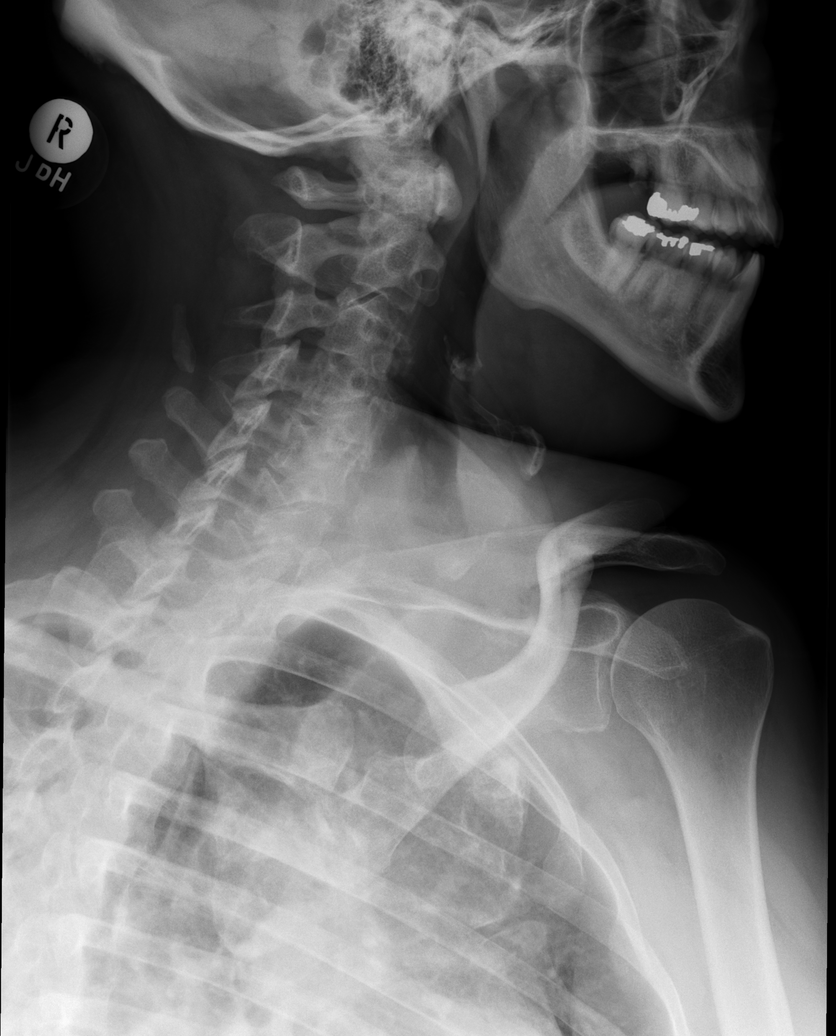

[w cervical spine ap_obl (2 of 2)]
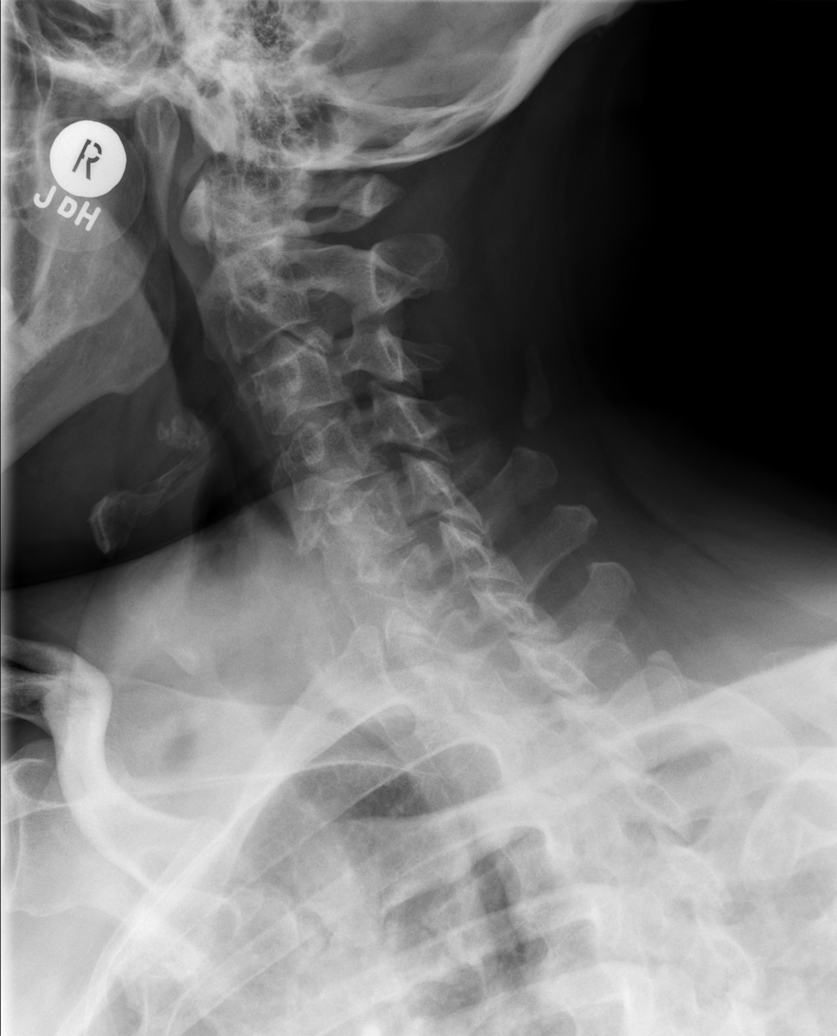

[w cervical spine ap]
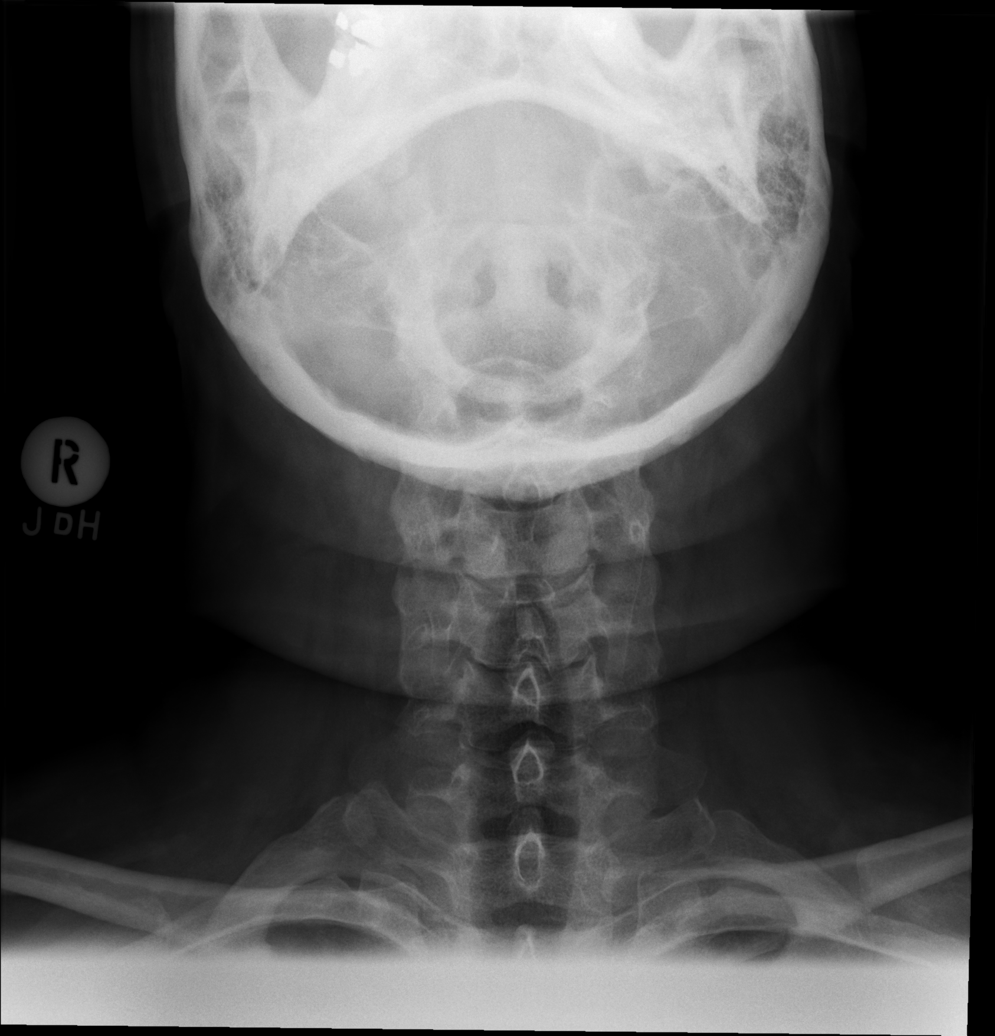

[w cervical spine odontoid (1 of 2)]
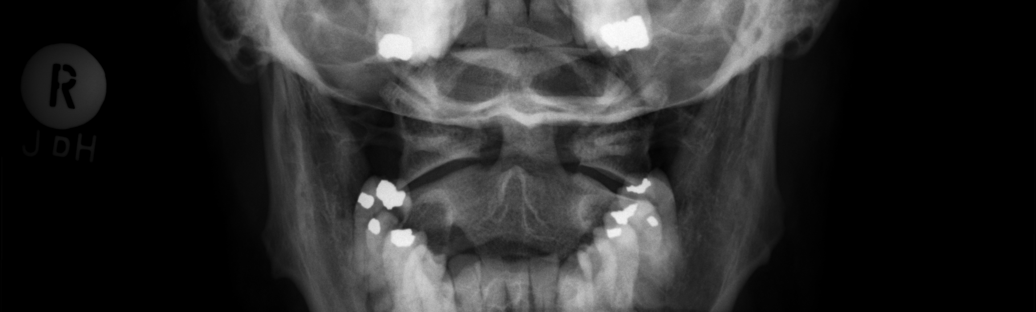

[w cervical spine odontoid (2 of 2)]
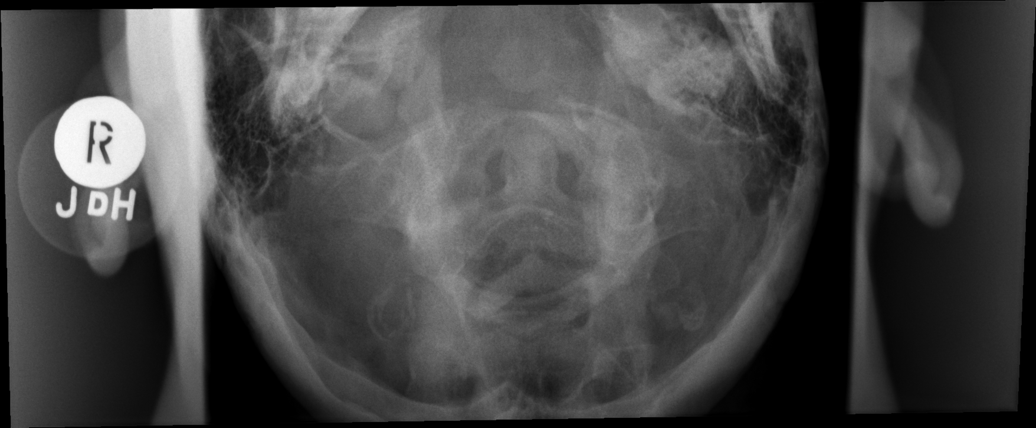

[7 of 7 positions shown; findings below may reference images not displayed]

FINDINGS: There is no evidence of cervical spine fracture or prevertebral soft
tissue swelling. Alignment is normal. No other significant bone
abnormalities are identified. Minimal degenerative disc disease with
disc height loss at C4-5 and C5-6. Neural foramina are patent.
IMPRESSION: Minimal degenerative disc disease with disc height loss at C4-5 and
C5-6.

## 2020-05-28 ENCOUNTER — Other Ambulatory Visit: Payer: Self-pay | Admitting: Internal Medicine

## 2020-05-28 DIAGNOSIS — F17210 Nicotine dependence, cigarettes, uncomplicated: Secondary | ICD-10-CM

## 2020-06-07 ENCOUNTER — Ambulatory Visit
Admission: RE | Admit: 2020-06-07 | Discharge: 2020-06-07 | Disposition: A | Discharge status: Home / Self Care | Payer: 59 | Source: Ambulatory Visit | Attending: Internal Medicine | Admitting: Internal Medicine

## 2020-06-07 DIAGNOSIS — F17210 Nicotine dependence, cigarettes, uncomplicated: Secondary | ICD-10-CM

## 2020-07-23 ENCOUNTER — Telehealth (HOSPITAL_COMMUNITY): Payer: Self-pay | Admitting: Internal Medicine

## 2020-07-23 NOTE — Telephone Encounter (Signed)
-----   Message from Scarlette Calico, RN sent at 07/22/2020  3:26 PM EDT ----- Marykay Lex can you get him sch to see DB please, last seen in 2017, not urgent, thanks

## 2021-10-07 ENCOUNTER — Other Ambulatory Visit: Payer: Self-pay | Admitting: Internal Medicine

## 2021-10-07 DIAGNOSIS — F17210 Nicotine dependence, cigarettes, uncomplicated: Secondary | ICD-10-CM

## 2021-10-24 ENCOUNTER — Other Ambulatory Visit: Payer: Self-pay

## 2021-10-24 ENCOUNTER — Ambulatory Visit
Admission: RE | Admit: 2021-10-24 | Discharge: 2021-10-24 | Disposition: A | Discharge status: Home / Self Care | Payer: No Typology Code available for payment source | Source: Ambulatory Visit | Attending: Internal Medicine | Admitting: Internal Medicine

## 2021-10-24 DIAGNOSIS — F17210 Nicotine dependence, cigarettes, uncomplicated: Secondary | ICD-10-CM

## 2022-08-11 ENCOUNTER — Encounter: Payer: Self-pay | Admitting: Gastroenterology

## 2022-08-17 ENCOUNTER — Telehealth: Payer: Self-pay | Admitting: *Deleted

## 2022-08-17 ENCOUNTER — Ambulatory Visit (AMBULATORY_SURGERY_CENTER): Payer: Self-pay | Admitting: *Deleted

## 2022-08-17 ENCOUNTER — Telehealth: Payer: Self-pay

## 2022-08-17 VITALS — Ht 70.0 in | Wt 285.0 lb

## 2022-08-17 DIAGNOSIS — Z1211 Encounter for screening for malignant neoplasm of colon: Secondary | ICD-10-CM

## 2022-08-17 MED ORDER — NA SULFATE-K SULFATE-MG SULF 17.5-3.13-1.6 GM/177ML PO SOLN: 1.0000 | Freq: Once | ORAL | 0 refills | Status: AC

## 2022-08-17 NOTE — Telephone Encounter (Signed)
Error patient needs appointment first

## 2022-08-17 NOTE — Telephone Encounter (Signed)
Sheila,  This pt is cleared for anesthetic care at [facility].  Thanks,  Saga Balthazar 

## 2022-08-17 NOTE — Telephone Encounter (Signed)
Can you please review pt. Chart and advise if can be done in [place]?

## 2022-08-17 NOTE — Progress Notes (Signed)
No egg or soy allergy known to patient  No issues known to pt with past sedation with any surgeries or procedures Patient denies ever being told they had issues or difficulty with intubation  No FH of Malignant Hyperthermia Pt is not on diet pills Pt is not on home 02  Pt is not on blood thinners  Pt denies issues with constipation  No A fib or A flutter Have any cardiac testing pending--NO Pt instructed to use Singlecare.com or GoodRx for a price reduction on prep   

## 2022-08-17 NOTE — Telephone Encounter (Signed)
NOTED

## 2022-09-10 ENCOUNTER — Encounter: Payer: No Typology Code available for payment source | Admitting: Gastroenterology

## 2022-10-05 DIAGNOSIS — S82002D Unspecified fracture of left patella, subsequent encounter for closed fracture with routine healing: Secondary | ICD-10-CM | POA: Diagnosis not present

## 2022-10-12 DIAGNOSIS — S82002D Unspecified fracture of left patella, subsequent encounter for closed fracture with routine healing: Secondary | ICD-10-CM | POA: Diagnosis not present

## 2022-10-13 DIAGNOSIS — S82002D Unspecified fracture of left patella, subsequent encounter for closed fracture with routine healing: Secondary | ICD-10-CM | POA: Diagnosis not present

## 2022-10-20 DIAGNOSIS — S82002D Unspecified fracture of left patella, subsequent encounter for closed fracture with routine healing: Secondary | ICD-10-CM | POA: Diagnosis not present

## 2022-10-27 ENCOUNTER — Encounter: Payer: Self-pay | Admitting: Gastroenterology

## 2022-11-03 ENCOUNTER — Telehealth: Payer: Self-pay | Admitting: Gastroenterology

## 2022-11-03 ENCOUNTER — Encounter: Payer: Self-pay | Admitting: Gastroenterology

## 2022-11-03 NOTE — Telephone Encounter (Signed)
Call to pt ,  let him know to come and pick up instructions by the 2nd floor front desk, verb understanding.

## 2022-11-03 NOTE — Telephone Encounter (Signed)
Inbound call from pt requesting a new instructions sheet for his procedure on 2/16 at 9:30 am..he will come to pick up instructions .Marland KitchenPlease advise

## 2022-11-06 ENCOUNTER — Ambulatory Visit (AMBULATORY_SURGERY_CENTER): Payer: BLUE CROSS/BLUE SHIELD | Admitting: Gastroenterology

## 2022-11-06 ENCOUNTER — Encounter: Payer: Self-pay | Admitting: Gastroenterology

## 2022-11-06 VITALS — BP 124/85 | HR 84 | Temp 98.0°F | Resp 20 | Ht 70.0 in | Wt 285.0 lb

## 2022-11-06 DIAGNOSIS — D128 Benign neoplasm of rectum: Secondary | ICD-10-CM | POA: Diagnosis not present

## 2022-11-06 DIAGNOSIS — Z1211 Encounter for screening for malignant neoplasm of colon: Secondary | ICD-10-CM

## 2022-11-06 DIAGNOSIS — K621 Rectal polyp: Secondary | ICD-10-CM | POA: Diagnosis not present

## 2022-11-06 HISTORY — PX: COLONOSCOPY: SHX174

## 2022-11-06 MED ORDER — SODIUM CHLORIDE 0.9 % IV SOLN: 500.0000 mL | Freq: Once | INTRAVENOUS | Status: DC

## 2022-11-06 NOTE — Patient Instructions (Signed)
-   Resume previous diet. - Continue present medications. - Await pathology results. - Repeat colonoscopy for surveillance based on  pathology results.  YOU HAD AN ENDOSCOPIC PROCEDURE TODAY AT [facility]:   Refer to the procedure report that was given to you for any specific questions about what was found during the examination.  If the procedure report does not answer your questions, please call your gastroenterologist to clarify.  If you requested that your care partner not be given the details of your procedure findings, then the procedure report has been included in a sealed envelope for you to review at your convenience later.  YOU SHOULD EXPECT: Some feelings of bloating in the abdomen. Passage of more gas than usual.  Walking can help get rid of the air that was put into your GI tract during the procedure and reduce the bloating. If you had a lower endoscopy (such as a colonoscopy or flexible sigmoidoscopy) you may notice spotting of blood in your stool or on the toilet paper. If you underwent a bowel prep for your procedure, you may not have a normal bowel movement for a few days.  Please Note:  You might notice some irritation and congestion in your nose or some drainage.  This is from the oxygen used during your procedure.  There is no need for concern and it should clear up in a day or so.  SYMPTOMS TO REPORT IMMEDIATELY:  Following lower endoscopy (colonoscopy or flexible sigmoidoscopy):  Excessive amounts of blood in the stool  Significant tenderness or worsening of abdominal pains  Swelling of the abdomen that is new, acute  Fever of 100F or higher   For urgent or emergent issues, a gastroenterologist can be reached at any hour by calling [phone]. Do not use MyChart messaging for urgent concerns.    DIET:  We do recommend a small meal at first, but then you may proceed to your regular diet.  Drink plenty of fluids but you should avoid alcoholic  beverages for 24 hours.  ACTIVITY:  You should plan to take it easy for the rest of today and you should NOT DRIVE or use heavy machinery until tomorrow (because of the sedation medicines used during the test).    FOLLOW UP: Our staff will call the number listed on your records the next business day following your procedure.  We will call around 7:15- 8:00 am to check on you and address any questions or concerns that you may have regarding the information given to you following your procedure. If we do not reach you, we will leave a message.     If any biopsies were taken you will be contacted by phone or by letter within the next 1-3 weeks.  Please call us at [phone] if you have not heard about the biopsies in 3 weeks.    SIGNATURES/CONFIDENTIALITY: You and/or your care partner have signed paperwork which will be entered into your electronic medical record.  These signatures attest to the fact that that the information above on your After Visit Summary has been reviewed and is understood.  Full responsibility of the confidentiality of this discharge information lies with you and/or your care-partner.

## 2022-11-06 NOTE — Progress Notes (Signed)
Called to room to assist during endoscopic procedure.  Patient ID and intended procedure confirmed with present staff. Received instructions for my participation in the procedure from the performing physician.  

## 2022-11-06 NOTE — Progress Notes (Signed)
Pt's states no medical or surgical changes since previsit or office visit. 

## 2022-11-06 NOTE — Progress Notes (Signed)
[facility] History and Physical   Primary Care Physician:  Jilda Panda, MD   Reason for Procedure:   CRC  screening  Plan:    colon     HPI: Stephen Kane is a 52 y.o. male    Past Medical History:  Diagnosis Date   Allergy    SEASONAL   Cardiomyopathy ([place])    CHF (congestive heart failure) (H[facility])    COPD (chronic obstructive pulmonary disease) ([place])    Diabetes mellitus without complication ([place])    Hyperlipidemia    Hypertension    Lumbar disc disease    Morbidly obese ([place])    Sleep apnea    C PAP    Past Surgical History:  Procedure Laterality Date   COLONOSCOPY  11/06/2022   LEFT AND RIGHT HEART CATHETERIZATION WITH CORONARY ANGIOGRAM N/A 07/24/2013   Procedure: LEFT AND RIGHT HEART CATHETERIZATION WITH CORONARY ANGIOGRAM;  Surgeon: Jolaine Artist, MD;  Location: [facility] CATH LAB;  Service: Cardiovascular;  Laterality: N/A;   LUMBAR LAMINECTOMY      Prior to Admission medications   Medication Sig Start Date End Date Taking? Authorizing Provider  atorvastatin (LIPITOR) 40 MG tablet TAKE ONE TABLET BY MOUTH ONCE DAILY AT 6 PM 07/10/16  Yes Bensimhon, Shaune Pascal, MD  BREZTRI AEROSPHERE 160-9-4.8 MCG/ACT AERO SMARTSIG:2 Puff(s) Via Inhaler Morning-Evening 03/20/22  Yes [provider]  calcitRIOL (ROCALTROL) 0.25 MCG capsule Take 0.25 mcg by mouth daily. 07/06/22  Yes [provider]  carvedilol (COREG) 12.5 MG tablet TAKE ONE AND ONE-HALF TABLETS BY MOUTH TWICE DAILY WITH MEALS 07/10/16  Yes Bensimhon, Shaune Pascal, MD  dapagliflozin propanediol (FARXIGA) 10 MG TABS tablet Farxiga   Yes [provider]  icosapent Ethyl (VASCEPA) 1 g capsule Take 2 g by mouth 2 (two) times daily.   Yes [provider]  lisinopril (PRINIVIL,ZESTRIL) 20 MG tablet Take 1 tablet (20 mg total) by mouth 2 (two) times daily. 07/10/16  Yes Bensimhon, Shaune Pascal, MD  Pitavastatin Calcium (LIVALO) 4 MG TABS Livalo   Yes [provider]   spironolactone (ALDACTONE) 25 MG tablet Take 0.5 tablets (12.5 mg total) by mouth daily. 07/10/16  Yes Bensimhon, Shaune Pascal, MD  TRESIBA FLEXTOUCH 200 UNIT/ML FlexTouch Pen INJECT 32 UNITS INTO THE SKIN EVERY DAY   Yes [provider]  cetirizine (ZYRTEC) 10 MG tablet Take 10 mg by mouth daily as needed for allergies.     [provider]  Chlorpheniramine-DM (CORICIDIN COUGH/COLD) 4-30 MG TABS Take 1 tablet by mouth daily as needed (cough/cold). Patient not taking: Reported on 08/17/2022    [provider]  Dulaglutide (TRULICITY) 3 0000000 SOPN Trulicity    [provider]  sildenafil (VIAGRA) 100 MG tablet TAKE ONE TABLET BY MOUTH EVERY DAY AS NEEDED APPROXIMATELY 1 HOUR BEFORE SEXUAL ACTIVITY    [provider]    Current Outpatient Medications  Medication Sig Dispense Refill   atorvastatin (LIPITOR) 40 MG tablet TAKE ONE TABLET BY MOUTH ONCE DAILY AT 6 PM 90 tablet 3   BREZTRI AEROSPHERE 160-9-4.8 MCG/ACT AERO SMARTSIG:2 Puff(s) Via Inhaler Morning-Evening     calcitRIOL (ROCALTROL) 0.25 MCG capsule Take 0.25 mcg by mouth daily.     carvedilol (COREG) 12.5 MG tablet TAKE ONE AND ONE-HALF TABLETS BY MOUTH TWICE DAILY WITH MEALS 270 tablet 3   dapagliflozin propanediol (FARXIGA) 10 MG TABS tablet Farxiga     icosapent Ethyl (VASCEPA) 1 g capsule Take 2 g by mouth 2 (two) times daily.  lisinopril (PRINIVIL,ZESTRIL) 20 MG tablet Take 1 tablet (20 mg total) by mouth 2 (two) times daily. 180 tablet 3   Pitavastatin Calcium (LIVALO) 4 MG TABS Livalo     spironolactone (ALDACTONE) 25 MG tablet Take 0.5 tablets (12.5 mg total) by mouth daily. 45 tablet 3   TRESIBA FLEXTOUCH 200 UNIT/ML FlexTouch Pen INJECT 32 UNITS INTO THE SKIN EVERY DAY     cetirizine (ZYRTEC) 10 MG tablet Take 10 mg by mouth daily as needed for allergies.      Chlorpheniramine-DM (CORICIDIN COUGH/COLD) 4-30 MG TABS Take 1 tablet by mouth daily as needed (cough/cold). (Patient not  taking: Reported on 08/17/2022)     Dulaglutide (TRULICITY) 3 0000000 SOPN Trulicity     sildenafil (VIAGRA) 100 MG tablet TAKE ONE TABLET BY MOUTH EVERY DAY AS NEEDED APPROXIMATELY 1 HOUR BEFORE SEXUAL ACTIVITY     Current Facility-Administered Medications  Medication Dose Route Frequency Provider Last Rate Last Admin   0.9 %  sodium chloride infusion  500 mL Intravenous Once Jackquline Denmark, MD        Allergies as of 11/06/2022   (No Known Allergies)    Family History  Problem Relation Age of Onset   Diabetes Mother    Diabetes Father    Obesity Sister    Colon cancer Neg Hx    Colon polyps Neg Hx    Crohn's disease Neg Hx    Esophageal cancer Neg Hx    Rectal cancer Neg Hx    Stomach cancer Neg Hx    Ulcerative colitis Neg Hx     Social History   Socioeconomic History   Marital status: Single    Spouse name: Not on file   Number of children: Not on file   Years of education: Not on file   Highest education level: Not on file  Occupational History   Not on file  Tobacco Use   Smoking status: Former    Packs/day: 1.00    Types: Cigarettes    Quit date: 03/21/2013    Years since quitting: 9.6    Passive exposure: Never   Smokeless tobacco: Never   Tobacco comments:    uses ecig  Vaping Use   Vaping Use: Never used  Substance and Sexual Activity   Alcohol use: Yes    Comment: SELDOM   Drug use: No   Sexual activity: Not on file  Other Topics Concern   Not on file  Social History Narrative   Not on file   Social Determinants of Health   Financial Resource Strain: Not on file  Food Insecurity: Not on file  Transportation Needs: Not on file  Physical Activity: Not on file  Stress: Not on file  Social Connections: Not on file  Intimate Partner Violence: Not on file    Review of Systems: Positive for none All other review of systems negative except as mentioned in the HPI.  Physical Exam: Vital signs in last 24 hours: @VSRANGES$ @   General:   Alert,   Well-developed, well-nourished, pleasant and cooperative in NAD Lungs:  Clear throughout to auscultation.   Heart:  Regular rate and rhythm; no murmurs, clicks, rubs,  or gallops. Abdomen:  Soft, nontender and nondistended. Normal bowel sounds.   Neuro/Psych:  Alert and cooperative. Normal mood and affect. A and O x 3    No significant changes were identified.  The patient continues to be an appropriate candidate for the planned procedure and anesthesia.   Carmell Austria, MD. [facility]  Gastroenterology 11/06/2022 9:27 AM@

## 2022-11-06 NOTE — Progress Notes (Signed)
Report to pacu rn. Vss. Care resumed by rn. 

## 2022-11-06 NOTE — Op Note (Signed)
[place] Patient Name: Stephen Kane Procedure Date: 11/06/2022 9:24 AM MRN: [id number] Endoscopist: Jackquline Denmark , MD, [id number] Age: 52 Referring MD:  Date of Birth: 15-Jul-1971 Gender: Male Account #: [id number] Procedure:                Colonoscopy Indications:              Screening for colorectal malignant neoplasm Medicines:                Monitored Anesthesia Care Procedure:                Pre-Anesthesia Assessment:                           - Prior to the procedure, a History and Physical                            was performed, and patient medications and                            allergies were reviewed. The patient's tolerance of                            previous anesthesia was also reviewed. The risks                            and benefits of the procedure and the sedation                            options and risks were discussed with the patient.                            All questions were answered, and informed consent                            was obtained. Prior Anticoagulants: The patient has                            taken no anticoagulant or antiplatelet agents. ASA                            Grade Assessment: II - A patient with mild systemic                            disease. After reviewing the risks and benefits,                            the patient was deemed in satisfactory condition to                            undergo the procedure.                           After obtaining informed consent, the colonoscope  was passed under direct vision. Throughout the                            procedure, the patient's blood pressure, pulse, and                            oxygen saturations were monitored continuously. The                            CF [id number] was introduced through the anus                            and advanced to the 2 cm into the ileum. The                            colonoscopy was  performed without difficulty. The                            patient tolerated the procedure well. The quality                            of the bowel preparation was good. The terminal                            ileum, ileocecal valve, appendiceal orifice, and                            rectum were photographed. Scope In: 9:36:32 AM Scope Out: 9:53:08 AM Scope Withdrawal Time: 0 hours 14 minutes 31 seconds  Total Procedure Duration: 0 hours 16 minutes 36 seconds  Findings:                 A 4 mm polyp was found in the mid rectum. The polyp                            was sessile. The polyp was removed with a cold                            snare. Resection and retrieval were complete.                           A few small-mouthed diverticula were found in the                            sigmoid colon.                           Non-bleeding internal hemorrhoids were found during                            retroflexion.                           The terminal ileum appeared normal.  The exam was otherwise without abnormality on                            direct and retroflexion views. Complications:            No immediate complications. Estimated Blood Loss:     Estimated blood loss: none. Impression:               - One 4 mm polyp in the mid rectum, removed with a                            cold snare. Resected and retrieved.                           - Mild sigmoid diverticulosis.                           - Non-bleeding internal hemorrhoids.                           - The examined portion of the ileum was normal.                           - The examination was otherwise normal on direct                            and retroflexion views. Recommendation:           - Patient has a contact number available for                            emergencies. The signs and symptoms of potential                            delayed complications were discussed with the                             patient. Return to normal activities tomorrow.                            Written discharge instructions were provided to the                            patient.                           - Resume previous diet.                           - Continue present medications.                           - Await pathology results.                           - Repeat colonoscopy for surveillance based on  pathology results.                           - The findings and recommendations were discussed                            with the patient's dad. Jackquline Denmark, MD 11/06/2022 9:55:36 AM This report has been signed electronically.

## 2022-11-09 ENCOUNTER — Telehealth: Payer: Self-pay | Admitting: *Deleted

## 2022-11-09 NOTE — Telephone Encounter (Signed)
  Follow up Call-     11/06/2022    9:05 AM  Call back number  Post procedure Call Back phone  # [phone]  Permission to leave phone message Yes     Patient questions:  Phone kept ringing.

## 2022-11-10 DIAGNOSIS — E782 Mixed hyperlipidemia: Secondary | ICD-10-CM | POA: Diagnosis not present

## 2022-11-10 DIAGNOSIS — E1165 Type 2 diabetes mellitus with hyperglycemia: Secondary | ICD-10-CM | POA: Diagnosis not present

## 2022-11-10 DIAGNOSIS — J449 Chronic obstructive pulmonary disease, unspecified: Secondary | ICD-10-CM | POA: Diagnosis not present

## 2022-11-10 DIAGNOSIS — I119 Hypertensive heart disease without heart failure: Secondary | ICD-10-CM | POA: Diagnosis not present

## 2022-11-14 ENCOUNTER — Encounter: Payer: Self-pay | Admitting: Gastroenterology

## 2022-11-23 DIAGNOSIS — S82002D Unspecified fracture of left patella, subsequent encounter for closed fracture with routine healing: Secondary | ICD-10-CM | POA: Diagnosis not present

## 2022-12-22 ENCOUNTER — Telehealth (HOSPITAL_COMMUNITY): Payer: Self-pay | Admitting: Internal Medicine

## 2022-12-22 NOTE — Telephone Encounter (Signed)
Pt graduated from [facility],  per [employer], need recent echo, spoke w/Rebecca, refer to general card.

## 2023-04-02 DIAGNOSIS — E782 Mixed hyperlipidemia: Secondary | ICD-10-CM | POA: Diagnosis not present

## 2023-04-02 DIAGNOSIS — I700 Atherosclerosis of aorta: Secondary | ICD-10-CM | POA: Diagnosis not present

## 2023-04-02 DIAGNOSIS — I119 Hypertensive heart disease without heart failure: Secondary | ICD-10-CM | POA: Diagnosis not present

## 2023-04-02 DIAGNOSIS — E1165 Type 2 diabetes mellitus with hyperglycemia: Secondary | ICD-10-CM | POA: Diagnosis not present

## 2023-04-02 DIAGNOSIS — G4733 Obstructive sleep apnea (adult) (pediatric): Secondary | ICD-10-CM | POA: Diagnosis not present

## 2023-04-02 DIAGNOSIS — J449 Chronic obstructive pulmonary disease, unspecified: Secondary | ICD-10-CM | POA: Diagnosis not present

## 2023-04-02 DIAGNOSIS — E559 Vitamin D deficiency, unspecified: Secondary | ICD-10-CM | POA: Diagnosis not present

## 2023-05-14 ENCOUNTER — Ambulatory Visit: Payer: BLUE CROSS/BLUE SHIELD | Admitting: Endocrinology

## 2023-05-18 ENCOUNTER — Ambulatory Visit: Payer: BLUE CROSS/BLUE SHIELD | Admitting: Endocrinology

## 2023-07-19 ENCOUNTER — Encounter: Payer: Self-pay | Admitting: Endocrinology

## 2023-07-19 ENCOUNTER — Ambulatory Visit: Payer: BLUE CROSS/BLUE SHIELD | Admitting: Endocrinology

## 2023-07-19 VITALS — BP 130/100 | HR 97 | Resp 20 | Ht 70.0 in | Wt 272.8 lb

## 2023-07-19 DIAGNOSIS — E119 Type 2 diabetes mellitus without complications: Secondary | ICD-10-CM

## 2023-07-19 DIAGNOSIS — Z794 Long term (current) use of insulin: Secondary | ICD-10-CM

## 2023-07-19 LAB — MICROALBUMIN / CREATININE URINE RATIO
Creatinine,U: 21.4 mg/dL
Microalb Creat Ratio: 4.3 mg/g (ref 0.0–30.0)
Microalb, Ur: 0.9 mg/dL (ref 0.0–1.9)

## 2023-07-19 LAB — BASIC METABOLIC PANEL
BUN: 19 mg/dL (ref 6–23)
CO2: 28 meq/L (ref 19–32)
Calcium: 10 mg/dL (ref 8.4–10.5)
Chloride: 97 meq/L (ref 96–112)
Creatinine, Ser: 0.79 mg/dL (ref 0.40–1.50)
GFR: 101.19 mL/min (ref >60.00)
Glucose, Bld: 275 mg/dL — ABNORMAL HIGH (ref 70–99)
Potassium: 5.2 meq/L — ABNORMAL HIGH (ref 3.5–5.1)
Sodium: 132 meq/L — ABNORMAL LOW (ref 135–145)

## 2023-07-19 LAB — POCT GLYCOSYLATED HEMOGLOBIN (HGB A1C): Hemoglobin A1C: 10.3 % — AB (ref 4.0–5.6)

## 2023-07-19 MED ORDER — LANCET DEVICE MISC: 1.0000 | Freq: Three times a day (TID) | 0 refills | Status: AC

## 2023-07-19 MED ORDER — FREESTYLE LIBRE 3 PLUS SENSOR MISC: 1.0000 | 3 refills | Status: DC

## 2023-07-19 MED ORDER — BLOOD GLUCOSE MONITORING SUPPL DEVI: 1.0000 | Freq: Three times a day (TID) | 0 refills | Status: AC

## 2023-07-19 MED ORDER — TIRZEPATIDE 10 MG/0.5ML ~~LOC~~ SOAJ: 10.0000 mg | SUBCUTANEOUS | 4 refills | Status: DC

## 2023-07-19 MED ORDER — LANCETS MISC. MISC: 1.0000 | Freq: Three times a day (TID) | 3 refills | Status: AC

## 2023-07-19 MED ORDER — TRESIBA FLEXTOUCH 200 UNIT/ML ~~LOC~~ SOPN: 38.0000 [IU] | PEN_INJECTOR | Freq: Every day | SUBCUTANEOUS | 4 refills | Status: DC

## 2023-07-19 MED ORDER — DAPAGLIFLOZIN PROPANEDIOL 10 MG PO TABS: 10.0000 mg | ORAL_TABLET | Freq: Every day | ORAL | 4 refills | Status: DC

## 2023-07-19 MED ORDER — BLOOD GLUCOSE TEST VI STRP: 1.0000 | ORAL_STRIP | Freq: Three times a day (TID) | 3 refills | Status: AC

## 2023-07-19 NOTE — Progress Notes (Signed)
Outpatient Endocrinology Note [place] Eduarda Scrivens, MD  07/19/23  Patient's Name: Stephen Kane    DOB: [birth year]    MRN: [id number]                                                    REASON OF VISIT: New consult of type 2 diabetes mellitus  REFERRING PROVIDER: Ralene Ok, MD  PCP: Ralene Ok, MD  HISTORY OF PRESENT ILLNESS:   Stephen Kane is a 52 y.o. old male with past medical history listed below, is here for new consult for type 2 diabetes mellitus.   Pertinent Diabetes History: Patient was diagnosed with type 2 diabetes mellitus in 2016.  Patient is referred to endocrinology due to poorly controlled type 2 diabetes mellitus.  Hemoglobin A1c in July 2024 per referral records was 14.3%.  Hemoglobin A1c today improved to 10.3%.  Diabetic complications and regimen as noted below.  Patient reports Trulicity was sent to [facility] few months ago.  He has noticed more effectiveness of Mounjaro and improving his blood sugar.  No GI issues related with Mounjaro.  In July 2024 LDL 64 and triglyceride 526.  Chronic Diabetes Complications : Retinopathy: no. Last ophthalmology exam was done on annually reportedly.  Nephropathy: no, on ACE/ARB /lisinopril. Peripheral neuropathy: no Coronary artery disease: no Stroke: no  Relevant comorbidities and cardiovascular risk factors: Obesity: yes Body mass index is 39.14 kg/m.  Hypertension: Yes  Hyperlipidemia : Yes, on statin   Current / Home Diabetic regimen includes:  Tresibe 38 units in the morning. Farxiga 10 mg daily.  Mounjaro 5 mg weekly.  Prior diabetic medications: Trulicity 4.5 mg was switched to [employer].  Glycemic data:   No glucose data to review.  He reports his blood sugar in the morning around 110 range.  He has not been checking blood sugar at home home.  He may has old glucometer.  He was using freestyle libre 3 and Dexcom in the past.  Hypoglycemia: Patient has no hypoglycemic episodes. Patient has  hypoglycemia awareness.  Factors modifying glucose control: 1.  Diabetic diet assessment: 3 meals a day.  2.  Staying active or exercising:   3.  Medication compliance: compliant all of the time.  Interval history  Patient presented to establish diabetes care.  REVIEW OF SYSTEMS As per history of present illness.   PAST MEDICAL HISTORY: Past Medical History:  Diagnosis Date   Allergy    SEASONAL   Cardiomyopathy (HCC)    CHF (congestive heart failure) (HCC)    COPD (chronic obstructive pulmonary disease) (HCC)    Diabetes mellitus without complication (HCC)    Hyperlipidemia    Hypertension    Lumbar disc disease    Morbidly obese (HCC)    Sleep apnea    C PAP    PAST SURGICAL HISTORY: Past Surgical History:  Procedure Laterality Date   COLONOSCOPY  11/06/2022   LEFT AND RIGHT HEART CATHETERIZATION WITH CORONARY ANGIOGRAM N/A 07/24/2013   Procedure: LEFT AND RIGHT HEART CATHETERIZATION WITH CORONARY ANGIOGRAM;  Surgeon: Dolores Patty, MD;  Location: [facility] CATH LAB;  Service: Cardiovascular;  Laterality: N/A;   LUMBAR LAMINECTOMY      ALLERGIES: No Known Allergies  FAMILY HISTORY:  Family History  Problem Relation Age of Onset   Diabetes Mother    Diabetes Father    Obesity Sister  Colon cancer Neg Hx    Colon polyps Neg Hx    Crohn's disease Neg Hx    Esophageal cancer Neg Hx    Rectal cancer Neg Hx    Stomach cancer Neg Hx    Ulcerative colitis Neg Hx     SOCIAL HISTORY: Social History   Socioeconomic History   Marital status: Single    Spouse name: Not on file   Number of children: Not on file   Years of education: Not on file   Highest education level: Not on file  Occupational History   Not on file  Tobacco Use   Smoking status: Former    Current packs/day: 0.00    Types: Cigarettes    Quit date: 03/21/2013    Years since quitting: 10.3    Passive exposure: Never   Smokeless tobacco: Never   Tobacco comments:    uses ecig  Vaping  Use   Vaping status: Never Used  Substance and Sexual Activity   Alcohol use: Yes    Comment: SELDOM   Drug use: No   Sexual activity: Not on file  Other Topics Concern   Not on file  Social History Narrative   Not on file   Social Determinants of Health   Financial Resource Strain: Not on file  Food Insecurity: Not on file  Transportation Needs: Not on file  Physical Activity: Not on file  Stress: Not on file  Social Connections: Not on file    MEDICATIONS:  Current Outpatient Medications  Medication Sig Dispense Refill   atorvastatin (LIPITOR) 40 MG tablet TAKE ONE TABLET BY MOUTH ONCE DAILY AT 6 PM 90 tablet 3   Blood Glucose Monitoring Suppl DEVI 1 each by Does not apply route in the morning, at noon, and at bedtime. May substitute to any manufacturer covered by patient's insurance. 1 each 0   BREZTRI AEROSPHERE 160-9-4.8 MCG/ACT AERO SMARTSIG:2 Puff(s) Via Inhaler Morning-Evening     calcitRIOL (ROCALTROL) 0.25 MCG capsule Take 0.25 mcg by mouth daily.     carvedilol (COREG) 12.5 MG tablet TAKE ONE AND ONE-HALF TABLETS BY MOUTH TWICE DAILY WITH MEALS 270 tablet 3   cetirizine (ZYRTEC) 10 MG tablet Take 10 mg by mouth daily as needed for allergies.      Chlorpheniramine-DM (CORICIDIN COUGH/COLD) 4-30 MG TABS Take 1 tablet by mouth daily as needed (cough/cold).     Continuous Glucose Sensor (FREESTYLE LIBRE 3 PLUS SENSOR) MISC 1 each by Does not apply route continuous. 6 each 3   Glucose Blood (BLOOD GLUCOSE TEST STRIPS) STRP 1 each by In Vitro route in the morning, at noon, and at bedtime. May substitute to any manufacturer covered by patient's insurance. 300 each 3   icosapent Ethyl (VASCEPA) 1 g capsule Take 2 g by mouth 2 (two) times daily.     Lancet Device MISC 1 each by Does not apply route in the morning, at noon, and at bedtime. May substitute to any manufacturer covered by patient's insurance. 1 each 0   Lancets Misc. MISC 1 each by Does not apply route in the  morning, at noon, and at bedtime. May substitute to any manufacturer covered by patient's insurance. 100 each 3   lisinopril (PRINIVIL,ZESTRIL) 20 MG tablet Take 1 tablet (20 mg total) by mouth 2 (two) times daily. 180 tablet 3   Pitavastatin Calcium (LIVALO) 4 MG TABS Livalo     sildenafil (VIAGRA) 100 MG tablet TAKE ONE TABLET BY MOUTH EVERY DAY AS NEEDED APPROXIMATELY  1 HOUR BEFORE SEXUAL ACTIVITY     spironolactone (ALDACTONE) 25 MG tablet Take 0.5 tablets (12.5 mg total) by mouth daily. 45 tablet 3   tirzepatide (MOUNJARO) 10 MG/0.5ML Pen Inject 10 mg into the skin once a week. 6 mL 4   dapagliflozin propanediol (FARXIGA) 10 MG TABS tablet Take 1 tablet (10 mg total) by mouth daily. 90 tablet 4   TRESIBA FLEXTOUCH 200 UNIT/ML FlexTouch Pen Inject 38 Units into the skin daily. 30 mL 4   No current facility-administered medications for this visit.    PHYSICAL EXAM: Vitals:   07/19/23 1008  BP: (!) 130/100  Pulse: 97  Resp: 20  SpO2: 96%  Weight: 272 lb 12.8 oz (123.7 kg)  Height: 5\' 10"  (1.778 m)   Body mass index is 39.14 kg/m.  Wt Readings from Last 3 Encounters:  07/19/23 272 lb 12.8 oz (123.7 kg)  11/06/22 285 lb (129.3 kg)  08/17/22 285 lb (129.3 kg)    General: Well developed, well nourished male in no apparent distress.  HEENT: AT/[place], no external lesions.  Eyes: Conjunctiva clear and no icterus. Neck: Neck supple  Lungs: Respirations not labored Neurologic: Alert, oriented, normal speech Extremities / Skin: Dry.   Psychiatric: Does not appear depressed or anxious  Diabetic Foot Exam - Simple   No data filed    LABS Reviewed Lab Results  Component Value Date   HGBA1C 10.3 (A) 07/19/2023   HGBA1C 16.4 (H) 07/10/2015   HGBA1C 6.0 (H) 07/23/2013   No results found for: "FRUCTOSAMINE" Lab Results  Component Value Date   CHOL 164 01/31/2014   HDL 46 01/31/2014   LDLCALC 71 01/31/2014   TRIG 235 (H) 01/31/2014   CHOLHDL 3.6 01/31/2014   No results found  for: "MICRALBCREAT" Lab Results  Component Value Date   CREATININE 0.79 07/10/2015   Lab Results  Component Value Date   GFR 122.08 10/12/2013    ASSESSMENT / PLAN  1. Diabetes mellitus without complication (HCC)   2. Type 2 diabetes mellitus without complication, with long-term current use of insulin (HCC)     Diabetes Mellitus type 2, complicated by no known complications. - Diabetic status / severity: Uncontrolled.  Lab Results  Component Value Date   HGBA1C 10.3 (A) 07/19/2023   Discussed about type 2 diabetes mellitus and potential chronic complications.  Discussed about importance of controlling blood sugar.  He has improving type 2 diabetes mellitus.  Hemoglobin A1c was 14.3 percent in July and has improved to 10.3% today.  Discussed that if he be compliant with diet and start exercise probably does not need mealtime insulin.  Adjusted diabetes regimen as follows.  - Hemoglobin A1c goal : <7%  No glucose data to review.  - Medications: See below.  I) continue Tresiba 38 units daily. II) increase Mounjaro from 5 to 10 mg weekly. III) continue Farxiga 10 mg daily.  - Home glucose testing: Before meals and bedtime.  Restart continuous glucose monitoring.  Sent prescription for freestyle libre 3+.  Patient generally does not want to use glucose monitoring with the phone with Internet, however he has a separate phone to use for CGM.  Sent prescription for glucometer and test supplies as well.  - Discussed/ Gave Hypoglycemia treatment plan.  # Consult : not required at this time.   # Annual urine for microalbuminuria/ creatinine ratio, no microalbuminuria currently, continue ACE/ARB /lisinopril.  Will check today urine microalbumin creatinine ratio and BMP. Last No results found for: "MICRALBCREAT"  #  Foot check nightly.  # Annual dilated diabetic eye exams.   - Diet: Make healthy diabetic food choices - Life style / activity / exercise: Discussed.  2. Blood  pressure  -  BP Readings from Last 1 Encounters:  07/19/23 (!) 130/100    - Control is not in target.  - No change in current plans.  Monitor blood pressure at home and discuss with primary care provider.  3. Lipid status / Hyperlipidemia - Last  Lab Results  Component Value Date   LDLCALC 71 01/31/2014   - Continue atorvastatin 40 mg daily.  Vascepa.  Managed by primary care provider.  Diagnoses and all orders for this visit:  Diabetes mellitus without complication (HCC) -     POCT glycosylated hemoglobin (Hb A1C)  Type 2 diabetes mellitus without complication, with long-term current use of insulin (HCC) -     Basic metabolic panel; Future -     Microalbumin / creatinine urine ratio; Future -     Microalbumin / creatinine urine ratio -     Basic metabolic panel  Other orders -     tirzepatide (MOUNJARO) 10 MG/0.5ML Pen; Inject 10 mg into the skin once a week. -     TRESIBA FLEXTOUCH 200 UNIT/ML FlexTouch Pen; Inject 38 Units into the skin daily. -     dapagliflozin propanediol (FARXIGA) 10 MG TABS tablet; Take 1 tablet (10 mg total) by mouth daily. -     Continuous Glucose Sensor (FREESTYLE LIBRE 3 PLUS SENSOR) MISC; 1 each by Does not apply route continuous. -     Blood Glucose Monitoring Suppl DEVI; 1 each by Does not apply route in the morning, at noon, and at bedtime. May substitute to any manufacturer covered by patient's insurance. -     Glucose Blood (BLOOD GLUCOSE TEST STRIPS) STRP; 1 each by In Vitro route in the morning, at noon, and at bedtime. May substitute to any manufacturer covered by patient's insurance. -     Lancet Device MISC; 1 each by Does not apply route in the morning, at noon, and at bedtime. May substitute to any manufacturer covered by patient's insurance. -     Lancets Misc. MISC; 1 each by Does not apply route in the morning, at noon, and at bedtime. May substitute to any manufacturer covered by patient's insurance.    DISPOSITION Follow up in  clinic in 3 months suggested.   All questions answered and patient verbalized understanding of the plan.  [place] Terrilynn Postell, MD [facility] Endocrinology [facility] [address] [place], [address] [place], [place] [address] Phone # [phone]  At least part of this note was generated using voice recognition software. Inadvertent word errors may have occurred, which were not recognized during the proofreading process.

## 2023-07-19 NOTE — Patient Instructions (Signed)
Diabetes regimen: Tresiba 38 units daily. Increase Mounjaro to 10 mg weekly. Farxiga 10 mg daily.  Libre 3 plus prescribed.  Check glucose before meals and at bedtime.

## 2023-08-30 DIAGNOSIS — Z23 Encounter for immunization: Secondary | ICD-10-CM | POA: Diagnosis not present

## 2023-08-30 DIAGNOSIS — E1165 Type 2 diabetes mellitus with hyperglycemia: Secondary | ICD-10-CM | POA: Diagnosis not present

## 2023-08-30 DIAGNOSIS — I119 Hypertensive heart disease without heart failure: Secondary | ICD-10-CM | POA: Diagnosis not present

## 2023-08-30 DIAGNOSIS — J449 Chronic obstructive pulmonary disease, unspecified: Secondary | ICD-10-CM | POA: Diagnosis not present

## 2023-08-30 DIAGNOSIS — E782 Mixed hyperlipidemia: Secondary | ICD-10-CM | POA: Diagnosis not present

## 2023-09-03 LAB — LAB REPORT - SCANNED
A1c: 11.3
EGFR: 108

## 2023-09-28 ENCOUNTER — Other Ambulatory Visit: Payer: Self-pay | Admitting: Internal Medicine

## 2023-09-28 DIAGNOSIS — Z87891 Personal history of nicotine dependence: Secondary | ICD-10-CM

## 2023-10-19 ENCOUNTER — Ambulatory Visit (INDEPENDENT_AMBULATORY_CARE_PROVIDER_SITE_OTHER): Payer: BC Managed Care – PPO | Admitting: Endocrinology

## 2023-10-19 ENCOUNTER — Encounter: Payer: Self-pay | Admitting: Endocrinology

## 2023-10-19 ENCOUNTER — Other Ambulatory Visit: Payer: Self-pay

## 2023-10-19 VITALS — BP 138/76 | HR 76 | Resp 20 | Ht 70.0 in | Wt 276.0 lb

## 2023-10-19 DIAGNOSIS — Z794 Long term (current) use of insulin: Secondary | ICD-10-CM

## 2023-10-19 DIAGNOSIS — E119 Type 2 diabetes mellitus without complications: Secondary | ICD-10-CM

## 2023-10-19 LAB — POCT GLYCOSYLATED HEMOGLOBIN (HGB A1C): Hemoglobin A1C: 9.6 % — AB (ref 4.0–5.6)

## 2023-10-19 MED ORDER — TIRZEPATIDE 12.5 MG/0.5ML ~~LOC~~ SOAJ: 12.5000 mg | SUBCUTANEOUS | 4 refills | Status: DC

## 2023-10-19 MED ORDER — TRESIBA FLEXTOUCH 200 UNIT/ML ~~LOC~~ SOPN: 42.0000 [IU] | PEN_INJECTOR | Freq: Every day | SUBCUTANEOUS | 4 refills | Status: DC

## 2023-10-19 MED ORDER — FREESTYLE LIBRE 3 PLUS SENSOR MISC: 1.0000 | 3 refills | Status: AC

## 2023-10-19 MED ORDER — DEXCOM G7 SENSOR MISC: 3.0000 | 4 refills | Status: DC

## 2023-10-19 NOTE — Patient Instructions (Signed)
Latest Reference Range & Units 07/10/15 15:38 07/19/23 10:13 10/19/23 08:48  Hemoglobin A1C 4.0 - 5.6 % 16.4 (H) 10.3 ! Pend 9.6 !  (H): Data is abnormally high !: Data is abnormal  Diabetes regimen: Tresiba 42 units daily. Increase Mounjaro to 12.5 mg weekly. Farxiga 10 mg daily.  Libre 3 plus / DEXCOM prescribed.  Check glucose before meals and at bedtime.

## 2023-10-19 NOTE — Progress Notes (Signed)
Outpatient Endocrinology Note Stephen Jakobie Henslee, MD  10/19/23  Patient's Name: Stephen Kane    DOB: 09-04-1971    MRN: [id number]                                                    REASON OF VISIT: Follow-up of type 2 diabetes mellitus  REFERRING PROVIDER: Ralene Ok, MD  PCP: Stephen Ok, MD  HISTORY OF PRESENT ILLNESS:   Stephen Kane is a 53 y.o. old male with past medical history listed below, is here for follow-up for type 2 diabetes mellitus.   Pertinent Diabetes History: Patient was diagnosed with type 2 diabetes mellitus in 2016.  Patient is referred to endocrinology due to poorly controlled type 2 diabetes mellitus, was initially seen in October 2024.  Chronic Diabetes Complications : Retinopathy: no. Last ophthalmology exam was done on annually reportedly.  Nephropathy: no, on ACE/ARB /lisinopril. Peripheral neuropathy: no Coronary artery disease: no Stroke: no  Relevant comorbidities and cardiovascular risk factors: Obesity: yes Body mass index is 39.6 kg/m.  Hypertension: Yes  Hyperlipidemia : Yes, on statin , in July 2024 LDL 64 and triglyceride 526.  Managed by primary care provider.  Current / Home Diabetic regimen includes:  Tresibe 38 units in the morning. Farxiga 10 mg daily.  Mounjaro 10 mg weekly.  Prior diabetic medications: Trulicity 4.5 mg was switched to [employer].  Glycemic data:   No glucose data to review.  Has not been checking blood sugar lately.  Hypoglycemia: Patient has no hypoglycemic episodes. Patient has hypoglycemia awareness.  Factors modifying glucose control: 1.  Diabetic diet assessment: 3 meals a day.  2.  Staying active or exercising:   3.  Medication compliance: compliant all of the time.  Interval history  No glucose data to review.  He has been on Mounjaro 10 mg weekly, started about 7 weeks ago, no GI issues.  Hemoglobin A1c today 9.6%  improving.  No other complaints today.  REVIEW OF SYSTEMS As per history  of present illness.   PAST MEDICAL HISTORY: Past Medical History:  Diagnosis Date   Allergy    SEASONAL   Cardiomyopathy (HCC)    CHF (congestive heart failure) (HCC)    COPD (chronic obstructive pulmonary disease) (HCC)    Diabetes mellitus without complication (HCC)    Hyperlipidemia    Hypertension    Lumbar disc disease    Morbidly obese (HCC)    Sleep apnea    C PAP    PAST SURGICAL HISTORY: Past Surgical History:  Procedure Laterality Date   COLONOSCOPY  11/06/2022   LEFT AND RIGHT HEART CATHETERIZATION WITH CORONARY ANGIOGRAM N/A 07/24/2013   Procedure: LEFT AND RIGHT HEART CATHETERIZATION WITH CORONARY ANGIOGRAM;  Surgeon: Dolores Patty, MD;  Location: [facility] CATH LAB;  Service: Cardiovascular;  Laterality: N/A;   LUMBAR LAMINECTOMY      ALLERGIES: No Known Allergies  FAMILY HISTORY:  Family History  Problem Relation Age of Onset   Diabetes Mother    Diabetes Father    Obesity Sister    Colon cancer Neg Hx    Colon polyps Neg Hx    Crohn's disease Neg Hx    Esophageal cancer Neg Hx    Rectal cancer Neg Hx    Stomach cancer Neg Hx    Ulcerative colitis Neg Hx  SOCIAL HISTORY: Social History   Socioeconomic History   Marital status: Single    Spouse name: Not on file   Number of children: Not on file   Years of education: Not on file   Highest education level: Not on file  Occupational History   Not on file  Tobacco Use   Smoking status: Former    Current packs/day: 0.00    Types: Cigarettes    Quit date: 03/21/2013    Years since quitting: 10.5    Passive exposure: Never   Smokeless tobacco: Never   Tobacco comments:    uses ecig  Vaping Use   Vaping status: Never Used  Substance and Sexual Activity   Alcohol use: Yes    Comment: SELDOM   Drug use: No   Sexual activity: Not on file  Other Topics Concern   Not on file  Social History Narrative   Not on file   Social Drivers of Health   Financial Resource Strain: Not on file   Food Insecurity: Not on file  Transportation Needs: Not on file  Physical Activity: Not on file  Stress: Not on file  Social Connections: Not on file    MEDICATIONS:  Current Outpatient Medications  Medication Sig Dispense Refill   atorvastatin (LIPITOR) 40 MG tablet TAKE ONE TABLET BY MOUTH ONCE DAILY AT 6 PM 90 tablet 3   Blood Glucose Monitoring Suppl DEVI 1 each by Does not apply route in the morning, at noon, and at bedtime. May substitute to any manufacturer covered by patient's insurance. 1 each 0   BREZTRI AEROSPHERE 160-9-4.8 MCG/ACT AERO SMARTSIG:2 Puff(s) Via Inhaler Morning-Evening     calcitRIOL (ROCALTROL) 0.25 MCG capsule Take 0.25 mcg by mouth daily.     carvedilol (COREG) 12.5 MG tablet TAKE ONE AND ONE-HALF TABLETS BY MOUTH TWICE DAILY WITH MEALS 270 tablet 3   cetirizine (ZYRTEC) 10 MG tablet Take 10 mg by mouth daily as needed for allergies.      Chlorpheniramine-DM (CORICIDIN COUGH/COLD) 4-30 MG TABS Take 1 tablet by mouth daily as needed (cough/cold).     Continuous Glucose Sensor (DEXCOM G7 SENSOR) MISC 3 each by Does not apply route every 30 (thirty) days. Apply 1 sensor every 10 days 9 each 4   dapagliflozin propanediol (FARXIGA) 10 MG TABS tablet Take 1 tablet (10 mg total) by mouth daily. 90 tablet 4   Glucose Blood (BLOOD GLUCOSE TEST STRIPS) STRP 1 each by In Vitro route in the morning, at noon, and at bedtime. May substitute to any manufacturer covered by patient's insurance. 300 each 3   icosapent Ethyl (VASCEPA) 1 g capsule Take 2 g by mouth 2 (two) times daily.     lisinopril (PRINIVIL,ZESTRIL) 20 MG tablet Take 1 tablet (20 mg total) by mouth 2 (two) times daily. 180 tablet 3   Pitavastatin Calcium (LIVALO) 4 MG TABS Livalo     sildenafil (VIAGRA) 100 MG tablet TAKE ONE TABLET BY MOUTH EVERY DAY AS NEEDED APPROXIMATELY 1 HOUR BEFORE SEXUAL ACTIVITY     spironolactone (ALDACTONE) 25 MG tablet Take 0.5 tablets (12.5 mg total) by mouth daily. 45 tablet 3    tirzepatide (MOUNJARO) 12.5 MG/0.5ML Pen Inject 12.5 mg into the skin once a week. 6 mL 4   Continuous Glucose Sensor (FREESTYLE LIBRE 3 PLUS SENSOR) MISC 1 each by Does not apply route continuous. 6 each 3   TRESIBA FLEXTOUCH 200 UNIT/ML FlexTouch Pen Inject 42 Units into the skin daily. 30 mL 4  No current facility-administered medications for this visit.    PHYSICAL EXAM: Vitals:   10/19/23 0846  BP: 138/76  Pulse: 76  Resp: 20  SpO2: 96%  Weight: 276 lb (125.2 kg)  Height: 5\' 10"  (1.778 m)   Body mass index is 39.6 kg/m.  Wt Readings from Last 3 Encounters:  10/19/23 276 lb (125.2 kg)  07/19/23 272 lb 12.8 oz (123.7 kg)  11/06/22 285 lb (129.3 kg)    General: Well developed, well nourished male in no apparent distress.  HEENT: AT/[place], no external lesions.  Eyes: Conjunctiva clear and no icterus. Neck: Neck supple  Lungs: Respirations not labored Neurologic: Alert, oriented, normal speech Extremities / Skin: Dry.   Psychiatric: Does not appear depressed or anxious  Diabetic Foot Exam - Simple   Simple Foot Form Diabetic Foot exam was performed with the following findings: Yes 10/19/2023  8:55 AM  Visual Inspection See comments: Yes Sensation Testing Intact to touch and monofilament testing bilaterally: Yes Pulse Check Posterior Tibialis and Dorsalis pulse intact bilaterally: Yes Comments Callus bilaterally mild.     LABS Reviewed Lab Results  Component Value Date   HGBA1C 9.6 (A) 10/19/2023   HGBA1C 10.3 (A) 07/19/2023   HGBA1C 16.4 (H) 07/10/2015   No results found for: "FRUCTOSAMINE" Lab Results  Component Value Date   CHOL 164 01/31/2014   HDL 46 01/31/2014   LDLCALC 71 01/31/2014   TRIG 235 (H) 01/31/2014   CHOLHDL 3.6 01/31/2014   Lab Results  Component Value Date   MICRALBCREAT 4.3 07/19/2023   Lab Results  Component Value Date   CREATININE 0.79 07/19/2023   Lab Results  Component Value Date   GFR 101.19 07/19/2023    ASSESSMENT /  PLAN  1. Diabetes mellitus without complication (HCC)   2. Type 2 diabetes mellitus without complication, with long-term current use of insulin (HCC)     Diabetes Mellitus type 2, complicated by no known complications. - Diabetic status / severity: Uncontrolled.  Lab Results  Component Value Date   HGBA1C 9.6 (A) 10/19/2023   Diabetes control has been improving, not still optimal.  - Hemoglobin A1c goal : <7%  No glucose data to review.  - Medications: See below.  I) increase Tresiba from 38 units to 42 units daily. II) increase Mounjaro from 10 to 12.5 mg weekly. III) continue Farxiga 10 mg daily.  - Home glucose testing: Before meals and bedtime.  Restart continuous glucose monitoring.  Sent prescription for freestyle libre 3+ and Dexcom G7.  Patient generally does not want to use glucose monitoring with the phone with Internet, however he has a separate phone to use for CGM.  Sent prescription for glucometer and test supplies as well.  - Discussed/ Gave Hypoglycemia treatment plan.  # Consult : not required at this time.   # Annual urine for microalbuminuria/ creatinine ratio, no microalbuminuria currently, continue ACE/ARB /lisinopril.    Recheck BMP today he had mildly low serum sodium and mildly high serum potassium in the last blood work in October.  Last  Lab Results  Component Value Date   MICRALBCREAT 4.3 07/19/2023    # Foot check nightly.  # Annual dilated diabetic eye exams.   - Diet: Make healthy diabetic food choices - Life style / activity / exercise: Discussed.  2. Blood pressure  -  BP Readings from Last 1 Encounters:  10/19/23 138/76    - Control is in target.  - No change in current plans.  3. Lipid status / Hyperlipidemia - Last  Lab Results  Component Value Date   LDLCALC 71 01/31/2014   - Continue atorvastatin 40 mg daily.  Vascepa.  Managed by primary care provider.  Diagnoses and all orders for this visit:  Diabetes mellitus  without complication (HCC) -     POCT glycosylated hemoglobin (Hb A1C)  Type 2 diabetes mellitus without complication, with long-term current use of insulin (HCC) -     POCT glycosylated hemoglobin (Hb A1C) -     tirzepatide (MOUNJARO) 12.5 MG/0.5ML Pen; Inject 12.5 mg into the skin once a week. -     Continuous Glucose Sensor (FREESTYLE LIBRE 3 PLUS SENSOR) MISC; 1 each by Does not apply route continuous. -     TRESIBA FLEXTOUCH 200 UNIT/ML FlexTouch Pen; Inject 42 Units into the skin daily. -     Continuous Glucose Sensor (DEXCOM G7 SENSOR) MISC; 3 each by Does not apply route every 30 (thirty) days. Apply 1 sensor every 10 days -     BASIC METABOLIC PANEL WITH GFR    DISPOSITION Follow up in clinic in 3 months suggested.   All questions answered and patient verbalized understanding of the plan.  Stephen Ashely Joshua, MD [facility] Endocrinology [facility] [address] [place], [address] [place], [place] [address] Phone # [phone]  At least part of this note was generated using voice recognition software. Inadvertent word errors may have occurred, which were not recognized during the proofreading process.

## 2023-10-20 ENCOUNTER — Encounter: Payer: Self-pay | Admitting: Endocrinology

## 2023-10-20 ENCOUNTER — Ambulatory Visit
Admission: RE | Admit: 2023-10-20 | Discharge: 2023-10-20 | Disposition: A | Discharge status: Home / Self Care | Payer: BLUE CROSS/BLUE SHIELD | Source: Ambulatory Visit | Attending: Internal Medicine | Admitting: Internal Medicine

## 2023-10-20 DIAGNOSIS — Z87891 Personal history of nicotine dependence: Secondary | ICD-10-CM | POA: Diagnosis not present

## 2023-10-20 LAB — BASIC METABOLIC PANEL WITH GFR
BUN: 15 mg/dL (ref 7–25)
CO2: 27 mmol/L (ref 20–32)
Calcium: 10.5 mg/dL — ABNORMAL HIGH (ref 8.6–10.3)
Chloride: 99 mmol/L (ref 98–110)
Creat: 0.73 mg/dL (ref 0.70–1.30)
Glucose, Bld: 160 mg/dL — ABNORMAL HIGH (ref 65–99)
Potassium: 4.8 mmol/L (ref 3.5–5.3)
Sodium: 138 mmol/L (ref 135–146)
eGFR: 108 mL/min/{1.73_m2} (ref >=60)

## 2023-10-27 DIAGNOSIS — E1142 Type 2 diabetes mellitus with diabetic polyneuropathy: Secondary | ICD-10-CM | POA: Diagnosis not present

## 2023-10-27 DIAGNOSIS — I739 Peripheral vascular disease, unspecified: Secondary | ICD-10-CM | POA: Diagnosis not present

## 2023-11-01 DIAGNOSIS — L718 Other rosacea: Secondary | ICD-10-CM | POA: Diagnosis not present

## 2023-11-01 DIAGNOSIS — L814 Other melanin hyperpigmentation: Secondary | ICD-10-CM | POA: Diagnosis not present

## 2023-11-01 DIAGNOSIS — L821 Other seborrheic keratosis: Secondary | ICD-10-CM | POA: Diagnosis not present

## 2023-11-03 ENCOUNTER — Telehealth: Payer: Self-pay

## 2023-11-03 ENCOUNTER — Other Ambulatory Visit (HOSPITAL_COMMUNITY): Payer: Self-pay

## 2023-11-03 NOTE — Telephone Encounter (Signed)
Pharmacy Patient Advocate Encounter   Received notification from CoverMyMeds that prior authorization for FreeStyle Libre 3 Plus Sensor is required/requested.   Insurance verification completed.   The patient is insured through [facility] .   Per test claim:  Dexcom is preferred by the insurance.  If suggested medication is appropriate, Please send in a new RX and discontinue this one. If not, please advise as to why it's not appropriate so that we may request a Prior Authorization. Please note, some preferred medications may still require a PA.  If the suggested medications have not been trialed and there are no contraindications to their use, the PA will not be submitted, as it will not be approved.

## 2023-11-10 ENCOUNTER — Other Ambulatory Visit: Payer: Self-pay | Admitting: Endocrinology

## 2023-11-10 DIAGNOSIS — Z794 Long term (current) use of insulin: Secondary | ICD-10-CM

## 2023-11-22 DIAGNOSIS — L814 Other melanin hyperpigmentation: Secondary | ICD-10-CM | POA: Diagnosis not present

## 2023-11-22 DIAGNOSIS — L821 Other seborrheic keratosis: Secondary | ICD-10-CM | POA: Diagnosis not present

## 2023-11-22 DIAGNOSIS — D229 Melanocytic nevi, unspecified: Secondary | ICD-10-CM | POA: Diagnosis not present

## 2023-11-22 DIAGNOSIS — L718 Other rosacea: Secondary | ICD-10-CM | POA: Diagnosis not present

## 2023-11-25 ENCOUNTER — Other Ambulatory Visit: Payer: Self-pay

## 2023-12-04 ENCOUNTER — Other Ambulatory Visit: Payer: Self-pay | Admitting: Endocrinology

## 2023-12-04 DIAGNOSIS — E119 Type 2 diabetes mellitus without complications: Secondary | ICD-10-CM

## 2023-12-31 ENCOUNTER — Telehealth: Payer: Self-pay

## 2023-12-31 NOTE — Telephone Encounter (Signed)
 Patient called and Mychart message sent to patient with instructions: Per MD no action needed

## 2024-01-18 ENCOUNTER — Ambulatory Visit: Payer: BC Managed Care – PPO | Admitting: Endocrinology

## 2024-02-02 LAB — LAB REPORT - SCANNED
A1c: 8.8
Albumin, Urine POC: 0.9
Creatinine, POC: 50 mg/dL
EGFR (Non-African Amer.): 112
Microalb Creat Ratio: 18
PSA, Total: 0.27

## 2024-05-08 ENCOUNTER — Ambulatory Visit: Admitting: Endocrinology

## 2024-05-09 ENCOUNTER — Encounter: Payer: Self-pay | Admitting: Endocrinology

## 2024-05-09 ENCOUNTER — Ambulatory Visit: Payer: Self-pay | Admitting: Endocrinology

## 2024-05-09 ENCOUNTER — Other Ambulatory Visit: Payer: Self-pay | Admitting: Endocrinology

## 2024-05-09 ENCOUNTER — Ambulatory Visit (INDEPENDENT_AMBULATORY_CARE_PROVIDER_SITE_OTHER): Admitting: Endocrinology

## 2024-05-09 VITALS — BP 112/80 | HR 101 | Resp 16 | Ht 70.0 in | Wt 273.8 lb

## 2024-05-09 DIAGNOSIS — E119 Type 2 diabetes mellitus without complications: Secondary | ICD-10-CM

## 2024-05-09 DIAGNOSIS — Z794 Long term (current) use of insulin: Secondary | ICD-10-CM | POA: Diagnosis not present

## 2024-05-09 LAB — POCT GLYCOSYLATED HEMOGLOBIN (HGB A1C): Hemoglobin A1C: 8.5 % — AB (ref 4.0–5.6)

## 2024-05-09 MED ORDER — DEXCOM G7 RECEIVER DEVI: 1.0000 | 0 refills | Status: AC

## 2024-05-09 MED ORDER — TIRZEPATIDE 15 MG/0.5ML ~~LOC~~ SOAJ: 15.0000 mg | SUBCUTANEOUS | 4 refills | Status: AC

## 2024-05-09 MED ORDER — DEXCOM G7 SENSOR MISC: 3.0000 | 4 refills | Status: AC

## 2024-05-09 MED ORDER — LANTUS SOLOSTAR 100 UNIT/ML ~~LOC~~ SOPN: 45.0000 [IU] | PEN_INJECTOR | Freq: Every day | SUBCUTANEOUS | 4 refills | Status: DC

## 2024-05-09 MED ORDER — DAPAGLIFLOZIN PROPANEDIOL 10 MG PO TABS: 10.0000 mg | ORAL_TABLET | Freq: Every day | ORAL | 3 refills | Status: AC

## 2024-05-09 NOTE — Progress Notes (Signed)
 Outpatient Endocrinology Note [place] Yalexa Blust, MD  05/09/24  Patient's Name: Stephen Kane    DOB: 1971-05-21    MRN: [id number]                                                    REASON OF VISIT: Follow-up of type 2 diabetes mellitus  REFERRING PROVIDER: Valma Carwin, MD  PCP: Valma Carwin, MD  HISTORY OF PRESENT ILLNESS:   Stephen Kane is a 53 y.o. old male with past medical history listed below, is here for follow-up for type 2 diabetes mellitus.   Pertinent Diabetes History: Patient was diagnosed with type 2 diabetes mellitus in 2016.  Patient is referred to endocrinology due to poorly controlled type 2 diabetes mellitus, was initially seen in October 2024.  Chronic Diabetes Complications : Retinopathy: no. Last ophthalmology exam was done on annually reportedly.  Nephropathy: no, on ACE/ARB /lisinopril . Peripheral neuropathy: no Coronary artery disease: no Stroke: no  Relevant comorbidities and cardiovascular risk factors: Obesity: yes Body mass index is 39.29 kg/m.  Hypertension: Yes  Hyperlipidemia : Yes, on statin  Current / Home Diabetic regimen includes:  Lantus  42 units in the morning. Farxiga  10 mg daily.  Mounjaro  12.5 mg weekly.  Prior diabetic medications: Trulicity 4.5 mg was switched to Mounjaro .  Glycemic data:   No glucose data to review.  Checking blood sugar occasionally.  He reports morning blood sugar 120-140 range.  Hypoglycemia: Patient has no hypoglycemic episodes. Patient has hypoglycemia awareness.  Factors modifying glucose control: 1.  Diabetic diet assessment: 3 meals a day.  2.  Staying active or exercising:   3.  Medication compliance: compliant all of the time.  Interval history  Diabetes regimen as reviewed above.  No glucose data to review.  Hemoglobin A1c improved to 8.5%.  He denies any GI issues on taking Mounjaro .  No other complaints today.  REVIEW OF SYSTEMS As per history of present illness.   PAST MEDICAL  HISTORY: Past Medical History:  Diagnosis Date   Allergy    SEASONAL   Cardiomyopathy (HCC)    CHF (congestive heart failure) (HCC)    COPD (chronic obstructive pulmonary disease) (HCC)    Diabetes mellitus without complication (HCC)    Hyperlipidemia    Hypertension    Lumbar disc disease    Morbidly obese (HCC)    Sleep apnea    C PAP    PAST SURGICAL HISTORY: Past Surgical History:  Procedure Laterality Date   COLONOSCOPY  11/06/2022   LEFT AND RIGHT HEART CATHETERIZATION WITH CORONARY ANGIOGRAM N/A 07/24/2013   Procedure: LEFT AND RIGHT HEART CATHETERIZATION WITH CORONARY ANGIOGRAM;  Surgeon: Toribio JONELLE Fuel, MD;  Location: [facility] CATH LAB;  Service: Cardiovascular;  Laterality: N/A;   LUMBAR LAMINECTOMY      ALLERGIES: No Known Allergies  FAMILY HISTORY:  Family History  Problem Relation Age of Onset   Diabetes Mother    Diabetes Father    Obesity Sister    Colon cancer Neg Hx    Colon polyps Neg Hx    Crohn's disease Neg Hx    Esophageal cancer Neg Hx    Rectal cancer Neg Hx    Stomach cancer Neg Hx    Ulcerative colitis Neg Hx     SOCIAL HISTORY: Social History   Socioeconomic History   Marital status: Single  Spouse name: Not on file   Number of children: Not on file   Years of education: Not on file   Highest education level: Not on file  Occupational History   Not on file  Tobacco Use   Smoking status: Former    Current packs/day: 0.00    Types: Cigarettes    Quit date: 03/21/2013    Years since quitting: 11.1    Passive exposure: Never   Smokeless tobacco: Never   Tobacco comments:    uses ecig  Vaping Use   Vaping status: Never Used  Substance and Sexual Activity   Alcohol use: Yes    Comment: SELDOM   Drug use: No   Sexual activity: Not on file  Other Topics Concern   Not on file  Social History Narrative   Not on file   Social Drivers of Health   Financial Resource Strain: Not on file  Food Insecurity: Not on file   Transportation Needs: Not on file  Physical Activity: Not on file  Stress: Not on file  Social Connections: Not on file    MEDICATIONS:  Current Outpatient Medications  Medication Sig Dispense Refill   atorvastatin  (LIPITOR) 40 MG tablet TAKE ONE TABLET BY MOUTH ONCE DAILY AT 6 PM 90 tablet 3   Blood Glucose Monitoring Suppl DEVI 1 each by Does not apply route in the morning, at noon, and at bedtime. May substitute to any manufacturer covered by patient's insurance. 1 each 0   BREZTRI AEROSPHERE 160-9-4.8 MCG/ACT AERO SMARTSIG:2 Puff(s) Via Inhaler Morning-Evening     calcitRIOL (ROCALTROL) 0.25 MCG capsule Take 0.25 mcg by mouth daily.     carvedilol  (COREG ) 12.5 MG tablet TAKE ONE AND ONE-HALF TABLETS BY MOUTH TWICE DAILY WITH MEALS 270 tablet 3   cetirizine (ZYRTEC) 10 MG tablet Take 10 mg by mouth daily as needed for allergies.      Chlorpheniramine-DM (CORICIDIN COUGH/COLD) 4-30 MG TABS Take 1 tablet by mouth daily as needed (cough/cold).     Continuous Glucose Receiver (DEXCOM G7 RECEIVER) DEVI 1 Device by Does not apply route continuous. 1 each 0   dapagliflozin  propanediol (FARXIGA ) 10 MG TABS tablet Take 1 tablet (10 mg total) by mouth daily before breakfast. 90 tablet 3   Glucose Blood (BLOOD GLUCOSE TEST STRIPS) STRP 1 each by In Vitro route in the morning, at noon, and at bedtime. May substitute to any manufacturer covered by patient's insurance. 300 each 3   icosapent Ethyl (VASCEPA) 1 g capsule Take 2 g by mouth 2 (two) times daily.     lisinopril  (PRINIVIL ,ZESTRIL ) 20 MG tablet Take 1 tablet (20 mg total) by mouth 2 (two) times daily. (Patient taking differently: Take 20 mg by mouth daily.) 180 tablet 3   Pitavastatin Calcium  (LIVALO) 4 MG TABS Livalo     sildenafil  (VIAGRA ) 100 MG tablet TAKE ONE TABLET BY MOUTH EVERY DAY AS NEEDED APPROXIMATELY 1 HOUR BEFORE SEXUAL ACTIVITY     spironolactone  (ALDACTONE ) 25 MG tablet Take 0.5 tablets (12.5 mg total) by mouth daily. 45 tablet  3   tirzepatide  (MOUNJARO ) 15 MG/0.5ML Pen Inject 15 mg into the skin once a week. 6 mL 4   Continuous Glucose Sensor (DEXCOM G7 SENSOR) MISC 3 each by Does not apply route every 30 (thirty) days. Apply 1 sensor every 10 days 9 each 4   Continuous Glucose Sensor (FREESTYLE LIBRE 3 PLUS SENSOR) MISC 1 each by Does not apply route continuous. (Patient not taking: Reported on 05/09/2024) 6  each 3   insulin glargine  (LANTUS  SOLOSTAR) 100 UNIT/ML Solostar Pen Inject 45 Units into the skin daily. Inject 42 Units into the skin daily. 30 mL 4   No current facility-administered medications for this visit.    PHYSICAL EXAM: Vitals:   05/09/24 1016  BP: 112/80  Pulse: (!) 101  Resp: 16  SpO2: 96%  Weight: 273 lb 12.8 oz (124.2 kg)  Height: 5' 10 (1.778 m)   Body mass index is 39.29 kg/m.  Wt Readings from Last 3 Encounters:  05/09/24 273 lb 12.8 oz (124.2 kg)  10/19/23 276 lb (125.2 kg)  07/19/23 272 lb 12.8 oz (123.7 kg)    General: Well developed, well nourished male in no apparent distress.  HEENT: AT/[place], no external lesions.  Eyes: Conjunctiva clear and no icterus. Neck: Neck supple  Lungs: Respirations not labored Neurologic: Alert, oriented, normal speech Extremities / Skin: Dry.   Psychiatric: Does not appear depressed or anxious  Diabetic Foot Exam - Simple   No data filed    LABS Reviewed Lab Results  Component Value Date   HGBA1C 8.5 (A) 05/09/2024   HGBA1C 9.6 (A) 10/19/2023   HGBA1C 10.3 (A) 07/19/2023   No results found for: FRUCTOSAMINE Lab Results  Component Value Date   CHOL 164 01/31/2014   HDL 46 01/31/2014   LDLCALC 71 01/31/2014   TRIG 235 (H) 01/31/2014   CHOLHDL 3.6 01/31/2014   Lab Results  Component Value Date   MICRALBCREAT 18 01/31/2024   Lab Results  Component Value Date   CREATININE 0.73 10/19/2023   Lab Results  Component Value Date   GFR 101.19 07/19/2023    ASSESSMENT / PLAN  1. Type 2 diabetes mellitus without complication,  with long-term current use of insulin (HCC)     Diabetes Mellitus type 2, complicated by no known complications. - Diabetic status / severity: Uncontrolled.  Slowly improving.  Lab Results  Component Value Date   HGBA1C 8.5 (A) 05/09/2024   Diabetes control has been improving, not still optimal.  - Hemoglobin A1c goal : <6.5%  No glucose data to review.  - Medications: See below.  I) increase Tresiba  from 42 units to 45 units daily. II) increase Mounjaro  from 12.5 to 15 mg weekly. III) continue Farxiga  10 mg daily.  - Home glucose testing: Before meals and bedtime.  Patient does not want to use his phone for the sensor.  He would like to have receiver for Dexcom G7.  Sent prescription.  - Discussed/ Gave Hypoglycemia treatment plan.  # Consult : not required at this time.   # Annual urine for microalbuminuria/ creatinine ratio, no microalbuminuria currently, continue ACE/ARB /lisinopril .    Last  Lab Results  Component Value Date   MICRALBCREAT 18 01/31/2024    # Foot check nightly.  # Annual dilated diabetic eye exams.   - Diet: Make healthy diabetic food choices - Life style / activity / exercise: Discussed.  2. Blood pressure  -  BP Readings from Last 1 Encounters:  05/09/24 112/80    - Control is in target.  - No change in current plans.    3. Lipid status / Hyperlipidemia - Last  Lab Results  Component Value Date   LDLCALC 71 01/31/2014   - Continue atorvastatin  40 mg daily.  Vascepa.  Managed by primary care provider.  Diagnoses and all orders for this visit:  Type 2 diabetes mellitus without complication, with long-term current use of insulin (HCC) -  POCT glycosylated hemoglobin (Hb A1C) -     dapagliflozin  propanediol (FARXIGA ) 10 MG TABS tablet; Take 1 tablet (10 mg total) by mouth daily before breakfast. -     tirzepatide  (MOUNJARO ) 15 MG/0.5ML Pen; Inject 15 mg into the skin once a week. -     insulin glargine  (LANTUS  SOLOSTAR) 100  UNIT/ML Solostar Pen; Inject 45 Units into the skin daily. Inject 42 Units into the skin daily. -     Continuous Glucose Sensor (DEXCOM G7 SENSOR) MISC; 3 each by Does not apply route every 30 (thirty) days. Apply 1 sensor every 10 days -     Continuous Glucose Receiver (DEXCOM G7 RECEIVER) DEVI; 1 Device by Does not apply route continuous.   DISPOSITION Follow up in clinic in 3 months suggested.   All questions answered and patient verbalized understanding of the plan.  [place]ra[place] Omar Orrego, MD [facility] Endocrinology [facility] [address] [place], [address] [place], [place] [address] Phone # [phone]  At least part of this note was generated using voice recognition software. Inadvertent word errors may have occurred, which were not recognized during the proofreading process.

## 2024-08-09 ENCOUNTER — Encounter: Payer: Self-pay | Admitting: Endocrinology

## 2024-08-09 ENCOUNTER — Ambulatory Visit: Payer: Self-pay | Admitting: Endocrinology

## 2024-08-09 ENCOUNTER — Ambulatory Visit: Admitting: Endocrinology

## 2024-08-09 VITALS — BP 132/82 | HR 104 | Resp 12 | Ht 70.0 in | Wt 263.6 lb

## 2024-08-09 DIAGNOSIS — E119 Type 2 diabetes mellitus without complications: Secondary | ICD-10-CM | POA: Diagnosis not present

## 2024-08-09 DIAGNOSIS — Z794 Long term (current) use of insulin: Secondary | ICD-10-CM | POA: Diagnosis not present

## 2024-08-09 LAB — POCT GLYCOSYLATED HEMOGLOBIN (HGB A1C): Hemoglobin A1C: 7.4 % — AB (ref 4.0–5.6)

## 2024-08-09 MED ORDER — LANTUS SOLOSTAR 100 UNIT/ML ~~LOC~~ SOPN: 45.0000 [IU] | PEN_INJECTOR | Freq: Every day | SUBCUTANEOUS | 4 refills | Status: DC

## 2024-08-09 NOTE — Progress Notes (Signed)
 Outpatient Endocrinology Note Stephen Limburg, MD  08/09/24  Patient's Name: Stephen Kane    DOB: 1971/07/02    MRN: [id number]                                                    REASON OF VISIT: Follow-up of type 2 diabetes mellitus  REFERRING PROVIDER: Valma Carwin, MD  PCP: Valma Carwin, MD  HISTORY OF PRESENT ILLNESS:   Stephen Kane is a 53 y.o. old male with past medical history listed below, is here for follow-up for type 2 diabetes mellitus.   Pertinent Diabetes History: Patient was diagnosed with type 2 diabetes mellitus in 2016.  Patient is referred to endocrinology due to poorly controlled type 2 diabetes mellitus, was initially seen in October 2024.  Chronic Diabetes Complications : Retinopathy: no. Last ophthalmology exam was done on annually reportedly.  Nephropathy: no, on ACE/ARB /lisinopril . Peripheral neuropathy: no Coronary artery disease: no Stroke: no  Relevant comorbidities and cardiovascular risk factors: Obesity: yes Body mass index is 37.82 kg/m.  Hypertension: Yes  Hyperlipidemia : Yes, on statin  Current / Home Diabetic regimen includes:  Lantus  42 units in the morning. Farxiga  10 mg daily.  Mounjaro  15 mg weekly.  Prior diabetic medications: Trulicity 4.5 mg was switched to Mounjaro .  Glycemic data:   No glucose data to review.  Checking blood sugar occasionally.  He reports morning blood sugar 120-130 range.  Hypoglycemia: Patient has no hypoglycemic episodes. Patient has hypoglycemia awareness.  Factors modifying glucose control: 1.  Diabetic diet assessment: 3 meals a day.  2.  Staying active or exercising:   3.  Medication compliance: compliant all of the time.  Interval history  Hemoglobin A1c 7.4% today, improving.  No glucose data to review.  He reports he is having somewhat high sugar in the morning.  Denies hypoglycemia.  Diabetes has been as reviewed and noted above.  No longer having nausea and GI issues.  He reports he  has been eating less after increasing dose of Mounjaro  in the last visit.  No other complaints today.  REVIEW OF SYSTEMS As per history of present illness.   PAST MEDICAL HISTORY: Past Medical History:  Diagnosis Date   Allergy    SEASONAL   Cardiomyopathy (HCC)    CHF (congestive heart failure) (HCC)    COPD (chronic obstructive pulmonary disease) (HCC)    Diabetes mellitus without complication (HCC)    Hyperlipidemia    Hypertension    Lumbar disc disease    Morbidly obese (HCC)    Sleep apnea    C PAP    PAST SURGICAL HISTORY: Past Surgical History:  Procedure Laterality Date   COLONOSCOPY  11/06/2022   LEFT AND RIGHT HEART CATHETERIZATION WITH CORONARY ANGIOGRAM N/A 07/24/2013   Procedure: LEFT AND RIGHT HEART CATHETERIZATION WITH CORONARY ANGIOGRAM;  Surgeon: Toribio JONELLE Fuel, MD;  Location: [facility] CATH LAB;  Service: Cardiovascular;  Laterality: N/A;   LUMBAR LAMINECTOMY      ALLERGIES: No Known Allergies  FAMILY HISTORY:  Family History  Problem Relation Age of Onset   Diabetes Mother    Diabetes Father    Obesity Sister    Colon cancer Neg Hx    Colon polyps Neg Hx    Crohn's disease Neg Hx    Esophageal cancer Neg Hx    Rectal  cancer Neg Hx    Stomach cancer Neg Hx    Ulcerative colitis Neg Hx     SOCIAL HISTORY: Social History   Socioeconomic History   Marital status: Single    Spouse name: Not on file   Number of children: Not on file   Years of education: Not on file   Highest education level: Not on file  Occupational History   Not on file  Tobacco Use   Smoking status: Former    Current packs/day: 0.00    Types: Cigarettes    Quit date: 03/21/2013    Years since quitting: 11.3    Passive exposure: Never   Smokeless tobacco: Never   Tobacco comments:    uses ecig  Vaping Use   Vaping status: Never Used  Substance and Sexual Activity   Alcohol use: Yes    Comment: SELDOM   Drug use: No   Sexual activity: Not on file  Other Topics  Concern   Not on file  Social History Narrative   Not on file   Social Drivers of Health   Financial Resource Strain: Not on file  Food Insecurity: Not on file  Transportation Needs: Not on file  Physical Activity: Not on file  Stress: Not on file  Social Connections: Not on file    MEDICATIONS:  Current Outpatient Medications  Medication Sig Dispense Refill   atorvastatin  (LIPITOR) 40 MG tablet TAKE ONE TABLET BY MOUTH ONCE DAILY AT 6 PM 90 tablet 3   Blood Glucose Monitoring Suppl DEVI 1 each by Does not apply route in the morning, at noon, and at bedtime. May substitute to any manufacturer covered by patient's insurance. 1 each 0   BREZTRI AEROSPHERE 160-9-4.8 MCG/ACT AERO SMARTSIG:2 Puff(s) Via Inhaler Morning-Evening     calcitRIOL (ROCALTROL) 0.25 MCG capsule Take 0.25 mcg by mouth daily.     carvedilol  (COREG ) 12.5 MG tablet TAKE ONE AND ONE-HALF TABLETS BY MOUTH TWICE DAILY WITH MEALS 270 tablet 3   cetirizine (ZYRTEC) 10 MG tablet Take 10 mg by mouth daily as needed for allergies.      Chlorpheniramine-DM (CORICIDIN COUGH/COLD) 4-30 MG TABS Take 1 tablet by mouth daily as needed (cough/cold).     Continuous Glucose Receiver (DEXCOM G7 RECEIVER) DEVI 1 Device by Does not apply route continuous. 1 each 0   Continuous Glucose Sensor (DEXCOM G7 SENSOR) MISC 3 each by Does not apply route every 30 (thirty) days. Apply 1 sensor every 10 days 9 each 4   Continuous Glucose Sensor (FREESTYLE LIBRE 3 PLUS SENSOR) MISC 1 each by Does not apply route continuous. 6 each 3   dapagliflozin  propanediol (FARXIGA ) 10 MG TABS tablet Take 1 tablet (10 mg total) by mouth daily before breakfast. 90 tablet 3   icosapent Ethyl (VASCEPA) 1 g capsule Take 2 g by mouth 2 (two) times daily.     lisinopril  (PRINIVIL ,ZESTRIL ) 20 MG tablet Take 1 tablet (20 mg total) by mouth 2 (two) times daily. (Patient taking differently: Take 20 mg by mouth daily.) 180 tablet 3   Pitavastatin Calcium  (LIVALO) 4 MG TABS  Livalo     sildenafil  (VIAGRA ) 100 MG tablet TAKE ONE TABLET BY MOUTH EVERY DAY AS NEEDED APPROXIMATELY 1 HOUR BEFORE SEXUAL ACTIVITY     spironolactone  (ALDACTONE ) 25 MG tablet Take 0.5 tablets (12.5 mg total) by mouth daily. 45 tablet 3   tirzepatide  (MOUNJARO ) 15 MG/0.5ML Pen Inject 15 mg into the skin once a week. 6 mL 4  insulin glargine  (LANTUS  SOLOSTAR) 100 UNIT/ML Solostar Pen Inject 45 Units into the skin daily. Inject 42 Units into the skin daily. 30 mL 4   No current facility-administered medications for this visit.    PHYSICAL EXAM: Vitals:   08/09/24 0841  BP: 132/82  Pulse: (!) 104  Resp: 12  SpO2: 96%  Weight: 263 lb 9.6 oz (119.6 kg)  Height: 5' 10 (1.778 m)    Body mass index is 37.82 kg/m.  Wt Readings from Last 3 Encounters:  08/09/24 263 lb 9.6 oz (119.6 kg)  05/09/24 273 lb 12.8 oz (124.2 kg)  10/19/23 276 lb (125.2 kg)    General: Well developed, well nourished male in no apparent distress.  HEENT: AT/[place], no external lesions.  Eyes: Conjunctiva clear and no icterus. Neck: Neck supple  Lungs: Respirations not labored Neurologic: Alert, oriented, normal speech Extremities / Skin: Dry.   Psychiatric: Does not appear depressed or anxious  Diabetic Foot Exam - Simple   No data filed    LABS Reviewed Lab Results  Component Value Date   HGBA1C 7.4 (A) 08/09/2024   HGBA1C 8.5 (A) 05/09/2024   HGBA1C 9.6 (A) 10/19/2023   No results found for: FRUCTOSAMINE Lab Results  Component Value Date   CHOL 164 01/31/2014   HDL 46 01/31/2014   LDLCALC 71 01/31/2014   TRIG 235 (H) 01/31/2014   CHOLHDL 3.6 01/31/2014   Lab Results  Component Value Date   MICRALBCREAT 18 01/31/2024   Lab Results  Component Value Date   CREATININE 0.73 10/19/2023   Lab Results  Component Value Date   GFR 101.19 07/19/2023    ASSESSMENT / PLAN  1. Type 2 diabetes mellitus without complication, with long-term current use of insulin (HCC)    Diabetes Mellitus  type 2, complicated by no known complications. - Diabetic status / severity: Uncontrolled.  Slowly improving.  Lab Results  Component Value Date   HGBA1C 7.4 (A) 08/09/2024   Diabetes control has been improving, not still optimal.  - Hemoglobin A1c goal : <6.5%  No glucose data to review.  - Medications: See below.  See below.  I) increase Lantus  from 42 to 45 units daily.  He is still taking 48 units daily in the morning.  II) continue Mounjaro  15 mg weekly.  III) continue Farxiga  10 mg daily.  - Home glucose testing: Before meals and bedtime.  Patient does not want to use his phone for the sensor.  He would like to have receiver for Dexcom G7.  Sent prescription.  - Discussed/ Gave Hypoglycemia treatment plan.  # Consult : not required at this time.   # Annual urine for microalbuminuria/ creatinine ratio, no microalbuminuria currently, continue ACE/ARB /lisinopril  /Farxiga .  Last  Lab Results  Component Value Date   MICRALBCREAT 18 01/31/2024    # Foot check nightly.  # Annual dilated diabetic eye exams.   - Diet: Make healthy diabetic food choices - Life style / activity / exercise: Discussed.  2. Blood pressure  -  BP Readings from Last 1 Encounters:  08/09/24 132/82    - Control is in target.  - No change in current plans.    3. Lipid status / Hyperlipidemia - Last  Lab Results  Component Value Date   LDLCALC 71 01/31/2014   - Continue atorvastatin  40 mg daily.  Vascepa.  Managed by primary care provider.  Diagnoses and all orders for this visit:  Type 2 diabetes mellitus without complication, with long-term current use  of insulin (HCC) -     POCT glycosylated hemoglobin (Hb A1C) -     insulin glargine  (LANTUS  SOLOSTAR) 100 UNIT/ML Solostar Pen; Inject 45 Units into the skin daily. Inject 42 Units into the skin daily.    DISPOSITION Follow up in clinic in 4 months suggested.  Labs on the same day of visit.   All questions answered and patient  verbalized understanding of the plan.  Jung Ingerson, MD [facility] Endocrinology [facility] [address] [place], [address] [place], [place] [address] Phone # [phone]  At least part of this note was generated using voice recognition software. Inadvertent word errors may have occurred, which were not recognized during the proofreading process.

## 2024-08-16 ENCOUNTER — Encounter: Payer: Self-pay | Admitting: Emergency Medicine

## 2024-08-16 ENCOUNTER — Ambulatory Visit: Admission: EM | Admit: 2024-08-16 | Discharge: 2024-08-16 | Disposition: A | Discharge status: Home / Self Care

## 2024-08-16 DIAGNOSIS — S61232A Puncture wound without foreign body of right middle finger without damage to nail, initial encounter: Secondary | ICD-10-CM

## 2024-08-16 MED ORDER — LEVOFLOXACIN 500 MG PO TABS: 500.0000 mg | ORAL_TABLET | Freq: Every day | ORAL | 0 refills | Status: AC

## 2024-08-16 MED ORDER — CLINDAMYCIN HCL 150 MG PO CAPS: 150.0000 mg | ORAL_CAPSULE | Freq: Three times a day (TID) | ORAL | 0 refills | Status: AC

## 2024-08-16 NOTE — Discharge Instructions (Signed)
 Evaluated for a finger infection which has not fully cleared up and therefore you will be started on additional antibiotics  Take clindamycin  every 8 hours for 7 days  Take Levaquin  every morning for 7 days  Clean over the affected area with unscented soap and water, pat do not rub, may cover with a nonstick dressing if at risk for becoming dirty  May continue to warm soaks for comfort as this helps to reduce swelling  May take Tylenol  as needed for pain  If symptoms continue to persist please follow-up with primary doctor or urgent care for reevaluation, at any point if symptoms worsen please go to the nearest emergency department

## 2024-08-16 NOTE — ED Triage Notes (Signed)
 Patient reports that he as bitten by his on dog 8 days ago on right middle finger. Patient was seen by his primary doctor and was placed on antibiotics. Patient states he is on his last dose of antibiotics. Patient states he is unable to bend finger now. Patient denies pain.

## 2024-08-16 NOTE — ED Provider Notes (Signed)
 Stephen Kane    CSN: [id number] Arrival date & time: 08/16/24  1128      History   Chief Complaint Chief Complaint  Patient presents with   Animal Bite   Finger Injury    HPI Stephen Kane is a 53 y.o. male.   Patient presents for evaluation of erythema and swelling to the right middle finger that began 8 days ago.  Bitten by his dog who is up-to-date on vaccines.  Was evaluated by his primary doctor who started him on Augmentin, last dose this morning.  Drainage and pain has resolved but due to swelling having difficulty bending the finger.  Denies presence of fever.  History of diabetes.   Past Medical History:  Diagnosis Date   Allergy    SEASONAL   Cardiomyopathy (HCC)    CHF (congestive heart failure) (HCC)    COPD (chronic obstructive pulmonary disease) (HCC)    Diabetes mellitus without complication (HCC)    Hyperlipidemia    Hypertension    Lumbar disc disease    Morbidly obese (HCC)    Sleep apnea    C PAP    Patient Active Problem List   Diagnosis Date Noted   Diabetes mellitus (HCC) 07/10/2015   CAD (coronary artery disease) 02/01/2014   Hypertension 02/01/2014   Chronic systolic heart failure (HCC) 08/03/2013   OSA on CPAP 08/03/2013   Acute systolic heart failure (HCC) 07/24/2013   Acute on chronic systolic congestive heart failure (HCC) 07/23/2013   Respiratory distress 07/23/2013   Hypertensive heart disease 07/23/2013   Congestive dilated cardiomyopathy (HCC) 07/23/2013   Morbid obesity (HCC) 07/23/2013   Flank pain 07/23/2013   Lumbar disc disease     Past Surgical History:  Procedure Laterality Date   COLONOSCOPY  11/06/2022   LEFT AND RIGHT HEART CATHETERIZATION WITH CORONARY ANGIOGRAM N/A 07/24/2013   Procedure: LEFT AND RIGHT HEART CATHETERIZATION WITH CORONARY ANGIOGRAM;  Surgeon: Toribio JONELLE Fuel, MD;  Location: [facility] CATH LAB;  Service: Cardiovascular;  Laterality: N/A;   LUMBAR LAMINECTOMY         Home  Medications    Prior to Admission medications   Medication Sig Start Date End Date Taking? Authorizing Provider  amoxicillin-clavulanate (AUGMENTIN) 500-125 MG tablet Take 1 tablet by mouth 2 (two) times daily. 08/08/24  Yes [provider]  clindamycin  (CLEOCIN ) 150 MG capsule Take 1 capsule (150 mg total) by mouth 3 (three) times daily for 7 days. 08/16/24 08/23/24 Yes Jaylenn Altier R, NP  levofloxacin  (LEVAQUIN ) 500 MG tablet Take 1 tablet (500 mg total) by mouth daily. 08/16/24  Yes Amarionna Arca R, NP  atorvastatin  (LIPITOR) 40 MG tablet TAKE ONE TABLET BY MOUTH ONCE DAILY AT 6 PM 07/10/16   Bensimhon, Toribio JONELLE, MD  Blood Glucose Monitoring Suppl DEVI 1 each by Does not apply route in the morning, at noon, and at bedtime. May substitute to any manufacturer covered by patient's insurance. 07/19/23   Thapa, Sudan, MD  BREZTRI AEROSPHERE 160-9-4.8 MCG/ACT AERO SMARTSIG:2 Puff(s) Via Inhaler Morning-Evening 03/20/22   [provider]  calcitRIOL (ROCALTROL) 0.25 MCG capsule Take 0.25 mcg by mouth daily. 07/06/22   [provider]  carvedilol  (COREG ) 12.5 MG tablet TAKE ONE AND ONE-HALF TABLETS BY MOUTH TWICE DAILY WITH MEALS 07/10/16   Bensimhon, Daniel R, MD  cetirizine (ZYRTEC) 10 MG tablet Take 10 mg by mouth daily as needed for allergies.     [provider]  Chlorpheniramine-DM (CORICIDIN COUGH/COLD) 4-30 MG TABS  Take 1 tablet by mouth daily as needed (cough/cold).    [provider]  Continuous Glucose Receiver (DEXCOM G7 RECEIVER) DEVI 1 Device by Does not apply route continuous. 05/09/24   Thapa, Sudan, MD  Continuous Glucose Sensor (DEXCOM G7 SENSOR) MISC 3 each by Does not apply route every 30 (thirty) days. Apply 1 sensor every 10 days 05/09/24   Thapa, Sudan, MD  Continuous Glucose Sensor (FREESTYLE LIBRE 3 PLUS SENSOR) MISC 1 each by Does not apply route continuous. 10/19/23   Thapa, Sudan, MD  dapagliflozin  propanediol (FARXIGA ) 10 MG TABS  tablet Take 1 tablet (10 mg total) by mouth daily before breakfast. 05/09/24   Thapa, Sudan, MD  icosapent Ethyl (VASCEPA) 1 g capsule Take 2 g by mouth 2 (two) times daily.    [provider]  insulin glargine  (LANTUS  SOLOSTAR) 100 UNIT/ML Solostar Pen Inject 45 Units into the skin daily. Inject 42 Units into the skin daily. 08/09/24   Thapa, Sudan, MD  lisinopril  (PRINIVIL ,ZESTRIL ) 20 MG tablet Take 1 tablet (20 mg total) by mouth 2 (two) times daily. Patient taking differently: Take 20 mg by mouth daily. 07/10/16   Bensimhon, Toribio SAUNDERS, MD  Pitavastatin Calcium  (LIVALO) 4 MG TABS Livalo    [provider]  sildenafil  (VIAGRA ) 100 MG tablet TAKE ONE TABLET BY MOUTH EVERY DAY AS NEEDED APPROXIMATELY 1 HOUR BEFORE SEXUAL ACTIVITY    [provider]  spironolactone  (ALDACTONE ) 25 MG tablet Take 0.5 tablets (12.5 mg total) by mouth daily. 07/10/16   Bensimhon, Toribio SAUNDERS, MD  tirzepatide  (MOUNJARO ) 15 MG/0.5ML Pen Inject 15 mg into the skin once a week. 05/09/24   Thapa, Sudan, MD    Family History Family History  Problem Relation Age of Onset   Diabetes Mother    Diabetes Father    Obesity Sister    Colon cancer Neg Hx    Colon polyps Neg Hx    Crohn's disease Neg Hx    Esophageal cancer Neg Hx    Rectal cancer Neg Hx    Stomach cancer Neg Hx    Ulcerative colitis Neg Hx     Social History Social History   Tobacco Use   Smoking status: Former    Current packs/day: 0.00    Types: Cigarettes    Quit date: 03/21/2013    Years since quitting: 11.4    Passive exposure: Never   Smokeless tobacco: Never   Tobacco comments:    uses ecig  Vaping Use   Vaping status: Never Used  Substance Use Topics   Alcohol use: Yes    Comment: SELDOM   Drug use: No     Allergies   Patient has no known allergies.   Review of Systems Review of Systems   Physical Exam Triage Vital Signs ED Triage Vitals  Encounter Vitals Group     BP 08/16/24 1158 130/80     Girls  Systolic BP Percentile --      Girls Diastolic BP Percentile --      Boys Systolic BP Percentile --      Boys Diastolic BP Percentile --      Pulse Rate 08/16/24 1158 72     Resp 08/16/24 1158 17     Temp 08/16/24 1158 98 F (36.7 C)     Temp Source 08/16/24 1158 Oral     SpO2 08/16/24 1156 98 %     Weight --      Height --      Head  Circumference --      Peak Flow --      Pain Score 08/16/24 1155 0     Pain Loc --      Pain Education --      Exclude from Growth Chart --    No data found.  Updated Vital Signs BP 130/80 (BP Location: Left Arm)   Pulse 72   Temp 98 F (36.7 C) (Oral)   Resp 17   SpO2 98%   Visual Acuity Right Eye Distance:   Left Eye Distance:   Bilateral Distance:    Right Eye Near:   Left Eye Near:    Bilateral Near:     Physical Exam Constitutional:      Appearance: Normal appearance.  Eyes:     Extraocular Movements: Extraocular movements intact.  Pulmonary:     Effort: Pulmonary effort is normal.  Neurological:     Mental Status: He is alert and oriented to person, place, and time.      UC Treatments / Results  Labs (all labs ordered are listed, but only abnormal results are displayed) Labs Reviewed - No data to display  EKG   Radiology No results found.  Procedures Procedures (including critical care time)  Medications Ordered in UC Medications - No data to display  Initial Impression / Assessment and Plan / UC Course  I have reviewed the triage vital signs and the nursing notes.  Pertinent labs & imaging results that were available during my care of the patient were reviewed by me and considered in my medical decision making (see chart for details).  Puncture wound of right middle finger  Presentation concerning for continued infection, per patient has improved but based on exam has not fully resolved, therefore initiating clindamycin  and Levaquin  as he just completed Augmentin, recommended continue supportive care  through over-the-counter analgesics and warm soaks, advised follow-up if no improvement seen and given strict ER precautions for any worsening symptoms Final Clinical Impressions(s) / UC Diagnoses   Final diagnoses:  Puncture wound of right middle finger     Discharge Instructions      Evaluated for a finger infection which has not fully cleared up and therefore you will be started on additional antibiotics  Take clindamycin  every 8 hours for 7 days  Take Levaquin  every morning for 7 days  Clean over the affected area with unscented soap and water, pat do not rub, may cover with a nonstick dressing if at risk for becoming dirty  May continue to warm soaks for comfort as this helps to reduce swelling  May take Tylenol  as needed for pain  If symptoms continue to persist please follow-up with primary doctor or urgent care for reevaluation, at any point if symptoms worsen please go to the nearest emergency department   ED Prescriptions     Medication Sig Dispense Auth. Provider   clindamycin  (CLEOCIN ) 150 MG capsule Take 1 capsule (150 mg total) by mouth 3 (three) times daily for 7 days. 21 capsule Jermiyah Ricotta R, NP   levofloxacin  (LEVAQUIN ) 500 MG tablet Take 1 tablet (500 mg total) by mouth daily. 7 tablet Aracelia Brinson R, NP      PDMP not reviewed this encounter.   Stephen Shelba SAUNDERS, NP 08/16/24 1234

## 2024-10-03 ENCOUNTER — Other Ambulatory Visit: Payer: Self-pay

## 2024-10-03 DIAGNOSIS — E119 Type 2 diabetes mellitus without complications: Secondary | ICD-10-CM

## 2024-10-03 MED ORDER — LANTUS SOLOSTAR 100 UNIT/ML ~~LOC~~ SOPN: 45.0000 [IU] | PEN_INJECTOR | Freq: Every day | SUBCUTANEOUS | 4 refills | Status: AC

## 2024-12-07 ENCOUNTER — Ambulatory Visit: Admitting: Endocrinology
